# Patient Record
Sex: Female | Born: 2000 | Race: White | Hispanic: Yes | Marital: Single | Smoking: Never smoker
Health system: Southern US, Community
[De-identification: ages and names within clinical notes are randomized; demographics above are authoritative.]

## PROBLEM LIST (undated history)

## (undated) DIAGNOSIS — E063 Autoimmune thyroiditis: Secondary | ICD-10-CM

## (undated) DIAGNOSIS — R7303 Prediabetes: Secondary | ICD-10-CM

## (undated) DIAGNOSIS — E669 Obesity, unspecified: Secondary | ICD-10-CM

## (undated) DIAGNOSIS — I10 Essential (primary) hypertension: Secondary | ICD-10-CM

## (undated) DIAGNOSIS — J45909 Unspecified asthma, uncomplicated: Secondary | ICD-10-CM

## (undated) HISTORY — DX: Prediabetes: R73.03

## (undated) HISTORY — PX: NO PAST SURGERIES: SHX2092

## (undated) HISTORY — DX: Essential (primary) hypertension: I10

## (undated) HISTORY — DX: Obesity, unspecified: E66.9

## (undated) HISTORY — DX: Autoimmune thyroiditis: E06.3

---
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
## 2019-07-17 ENCOUNTER — Encounter: Payer: Self-pay | Admitting: Family Medicine

## 2019-07-17 ENCOUNTER — Ambulatory Visit (INDEPENDENT_AMBULATORY_CARE_PROVIDER_SITE_OTHER): Payer: Medicaid Other | Admitting: Family Medicine

## 2019-07-17 ENCOUNTER — Other Ambulatory Visit: Payer: Self-pay

## 2019-07-17 VITALS — BP 128/76 | HR 98 | Wt 228.6 lb

## 2019-07-17 DIAGNOSIS — R7303 Prediabetes: Secondary | ICD-10-CM

## 2019-07-17 DIAGNOSIS — O0990 Supervision of high risk pregnancy, unspecified, unspecified trimester: Secondary | ICD-10-CM | POA: Insufficient documentation

## 2019-07-17 DIAGNOSIS — Z3401 Encounter for supervision of normal first pregnancy, first trimester: Secondary | ICD-10-CM

## 2019-07-17 LAB — POCT URINE PREGNANCY: Preg Test, Ur: POSITIVE — AB

## 2019-07-17 LAB — POCT GLYCOSYLATED HEMOGLOBIN (HGB A1C): Hemoglobin A1C: 5.2 % (ref 4.0–5.6)

## 2019-07-17 MED ORDER — PRENATAL ADULT GUMMY/DHA/FA 0.4-25 MG PO CHEW: 1.0000 | CHEWABLE_TABLET | Freq: Every day | ORAL | 6 refills | Status: DC

## 2019-07-17 MED ORDER — DOXYLAMINE-PYRIDOXINE 10-10 MG PO TBEC: DELAYED_RELEASE_TABLET | ORAL | 0 refills | Status: DC

## 2019-07-17 NOTE — Progress Notes (Addendum)
   Subjective:    Jenna Benson - 18 y.o. female MRN [id number]  Date of birth: [birth year]  CC:  Jenna Benson is here to establish care.  She also has had several recent positive home pregnancy tests.  HPI:  Pregnancy Reports 3 positive home pregnancy tests, pregnancy test in our clinic is also positive Pregnancy was not planned, but patient is excited about this LMP Sept 30, although she says that her menstrual periods are irregular due to PCOS Has one female partner, he is supportive Family has been told about the pregnancy and is supportive Does report nausea and vomiting - no alleviating factors, exacerbating factors include eating, also seems to be worse in the mornings Has not been taking prenatal vitamins Continues to take her levothyroxine, has stopped metformin Does not smoke or consume alcohol   Health Maintenance:  Health Maintenance Due  Topic Date Due  . PNEUMOCOCCAL POLYSACCHARIDE VACCINE AGE 42-64 HIGH RISK  05/08/2002  . FOOT EXAM  05/08/2010  . OPHTHALMOLOGY EXAM  05/08/2010  . URINE MICROALBUMIN  05/08/2010  . INFLUENZA VACCINE  02/17/2019    -  reports that she has never smoked. She has never used smokeless tobacco. - Review of Systems: Per HPI. - Past Medical History: Patient Active Problem List   Diagnosis Date Noted  . First pregnancy, first trimester 07/17/2019  . Elevated blood pressure reading 11/09/2017  . PCOS (polycystic ovarian syndrome) 07/04/2017  . Acanthosis nigricans 05/31/2017  . Other allergic rhinitis 06/23/2015  . Hidradenitis suppurativa 11/10/2014  . Adjustment reaction 06/18/2013  . Dyspepsia 08/09/2012  . Goiter 08/09/2012  . Hypothyroidism, acquired, autoimmune   . Obesity 10/07/2011  . Prediabetes 10/07/2011   - Medications: reviewed and updated   Objective:   Physical Exam BP 128/76   Pulse 98   Wt 228 lb 9.6 oz (103.7 kg)   LMP 04/18/2019 (Exact Date)   SpO2 99%   BMI 39.32 kg/m  Gen: NAD, alert,  cooperative with exam, well-appearing, obese, pleasant CV: RRR, good S1/S2, no murmur, no edema Resp: CTABL, no wheezes, non-labored Skin: no rashes, normal turgor  Neuro: no gross deficits.  Psych: good insight, alert and oriented  FHT: Unable to find with Doppler        Assessment & Plan:   First pregnancy, first trimester LMP is considered to be accurate, gestational age is currently 12 weeks 6 days.  Since LMP's are irregular, will order dating ultrasound.  Will also perform prenatal labs today.  Counseled patient on dietary changes for pregnancy and the importance of starting a prenatal vitamin.  Sent prenatal vitamins to her pharmacy as well as a prescription for Diclegis for nausea and vomiting.  We will also obtain a TSH with free T4 today since patient has concurrent hypothyroidism.  This diagnosis means that this is a high risk pregnancy, so we will place referral for OB/GYN.  We will plan to do her initial prenatal visit if she has not seen OB/GYN by then.  Prediabetes Hemoglobin A1c obtained, was 5.2 today, which is reassuring    Maia Breslow, M.D. 07/18/2019, 12:03 PM PGY-3, [facility]

## 2019-07-17 NOTE — Patient Instructions (Signed)
It was nice meeting you today [place]!  I have sent in prenatal vitamins and medicine for your nausea.  Please take these as directed.  We are also referring you to the high risk OB clinic because of your hypothyroidism.  We will continue to take care of you until they can start your OB care.  Today we are gathering OB labs and will check a TSH as well.  We have also scheduled your dating ultrasound.  If you have any questions or concerns, please feel free to call the clinic.   Be well,  Dr. Shan Levans

## 2019-07-18 LAB — OBSTETRIC PANEL, INCLUDING HIV
Antibody Screen: NEGATIVE
Basophils Absolute: 0 10*3/uL (ref 0.0–0.2)
Basos: 0 %
EOS (ABSOLUTE): 0.2 10*3/uL (ref 0.0–0.4)
Eos: 2 %
HIV Screen 4th Generation wRfx: NONREACTIVE
Hematocrit: 40.4 % (ref 34.0–46.6)
Hemoglobin: 13.8 g/dL (ref 11.1–15.9)
Hepatitis B Surface Ag: NEGATIVE
Immature Grans (Abs): 0 10*3/uL (ref 0.0–0.1)
Immature Granulocytes: 0 %
Lymphocytes Absolute: 2.3 10*3/uL (ref 0.7–3.1)
Lymphs: 19 %
MCH: 29.4 pg (ref 26.6–33.0)
MCHC: 34.2 g/dL (ref 31.5–35.7)
MCV: 86 fL (ref 79–97)
Monocytes Absolute: 0.7 10*3/uL (ref 0.1–0.9)
Monocytes: 6 %
Neutrophils Absolute: 9.2 10*3/uL — ABNORMAL HIGH (ref 1.4–7.0)
Neutrophils: 73 %
Platelets: 582 10*3/uL — ABNORMAL HIGH (ref 150–450)
RBC: 4.69 x10E6/uL (ref 3.77–5.28)
RDW: 12.9 % (ref 11.7–15.4)
RPR Ser Ql: NONREACTIVE
Rh Factor: POSITIVE
Rubella Antibodies, IGG: 2.21 index (ref >0.99)
WBC: 12.5 10*3/uL — ABNORMAL HIGH (ref 3.4–10.8)

## 2019-07-18 LAB — TSH+FREE T4
Free T4: 1.58 ng/dL (ref 0.93–1.60)
TSH: 1.75 u[IU]/mL (ref 0.450–4.500)

## 2019-07-18 NOTE — Assessment & Plan Note (Signed)
Hemoglobin A1c obtained, was 5.2 today, which is reassuring

## 2019-07-18 NOTE — Assessment & Plan Note (Addendum)
LMP is considered to be accurate, gestational age is currently 12 weeks 6 days.  Since LMP's are irregular, will order dating ultrasound.  Will also perform prenatal labs today.  Counseled patient on dietary changes for pregnancy and the importance of starting a prenatal vitamin.  Sent prenatal vitamins to her pharmacy as well as a prescription for Diclegis for nausea and vomiting.  We will also obtain a TSH with free T4 today since patient has concurrent hypothyroidism.  This diagnosis means that this is a high risk pregnancy, so we will place referral for OB/GYN.  We will plan to do her initial prenatal visit if she has not seen OB/GYN by then.

## 2019-07-20 NOTE — L&D Delivery Note (Signed)
Delivery Note At 12:52 AM a viable female was delivered via Vaginal, Spontaneous (Presentation: LOA).  APGAR: 9,9 ; weight 3765g.   Placenta status: intact, 3v cord .  Anesthesia: Epidural Episiotomy: None Lacerations: 1st degree;Perineal;left and right Labial Suture Repair: 2.0 Est. Blood Loss (mL): 442  Mom to postpartum.  Baby to Couplet care / Skin to Skin.  Delivery: Called to room and patient was complete and pushing. Head delivered LOA. No nuchal cord present. Shoulder and body delivered in usual fashion. Infant with spontaneous cry, placed on mother's abdomen, dried and stimulated. Cord clamped x 2 after 1-minute delay, and cut by FOB under my direct supervision. Cord blood drawn. Placenta delivered spontaneously with gentle cord traction. Fundus firm with massage and Pitocin. Labia, perineum, vagina, and cervix were inspected, as noted above.    Alric Seton 03/06/2020, 3:42 AM

## 2019-07-25 ENCOUNTER — Telehealth: Payer: Self-pay | Admitting: Radiology

## 2019-07-25 NOTE — Telephone Encounter (Signed)
Left message for patient to call [facility] to schedule New OB appointment from referral

## 2019-08-01 ENCOUNTER — Other Ambulatory Visit: Payer: Self-pay

## 2019-08-01 ENCOUNTER — Other Ambulatory Visit: Payer: Self-pay | Admitting: Family Medicine

## 2019-08-01 ENCOUNTER — Ambulatory Visit (HOSPITAL_COMMUNITY)
Admission: RE | Admit: 2019-08-01 | Discharge: 2019-08-01 | Disposition: A | Discharge status: Home / Self Care | Payer: Medicaid Other | Source: Ambulatory Visit | Attending: Family Medicine | Admitting: Family Medicine

## 2019-08-01 DIAGNOSIS — Z3A08 8 weeks gestation of pregnancy: Secondary | ICD-10-CM | POA: Diagnosis not present

## 2019-08-01 DIAGNOSIS — Z3401 Encounter for supervision of normal first pregnancy, first trimester: Secondary | ICD-10-CM | POA: Insufficient documentation

## 2019-08-01 DIAGNOSIS — O26841 Uterine size-date discrepancy, first trimester: Secondary | ICD-10-CM | POA: Diagnosis not present

## 2019-08-13 ENCOUNTER — Ambulatory Visit (INDEPENDENT_AMBULATORY_CARE_PROVIDER_SITE_OTHER): Payer: Medicaid Other | Admitting: Obstetrics & Gynecology

## 2019-08-13 ENCOUNTER — Other Ambulatory Visit: Payer: Self-pay

## 2019-08-13 ENCOUNTER — Encounter: Payer: Self-pay | Admitting: Obstetrics & Gynecology

## 2019-08-13 ENCOUNTER — Other Ambulatory Visit (HOSPITAL_COMMUNITY)
Admission: RE | Admit: 2019-08-13 | Discharge: 2019-08-13 | Disposition: A | Discharge status: Home / Self Care | Payer: Medicaid Other | Source: Ambulatory Visit | Attending: Obstetrics & Gynecology | Admitting: Obstetrics & Gynecology

## 2019-08-13 VITALS — BP 132/81 | HR 82 | Wt 225.0 lb

## 2019-08-13 DIAGNOSIS — Z23 Encounter for immunization: Secondary | ICD-10-CM | POA: Diagnosis not present

## 2019-08-13 DIAGNOSIS — O99211 Obesity complicating pregnancy, first trimester: Secondary | ICD-10-CM

## 2019-08-13 DIAGNOSIS — O99210 Obesity complicating pregnancy, unspecified trimester: Secondary | ICD-10-CM

## 2019-08-13 DIAGNOSIS — Z3A09 9 weeks gestation of pregnancy: Secondary | ICD-10-CM | POA: Diagnosis not present

## 2019-08-13 DIAGNOSIS — E039 Hypothyroidism, unspecified: Secondary | ICD-10-CM | POA: Insufficient documentation

## 2019-08-13 DIAGNOSIS — Z3401 Encounter for supervision of normal first pregnancy, first trimester: Secondary | ICD-10-CM | POA: Diagnosis not present

## 2019-08-13 DIAGNOSIS — O98811 Other maternal infectious and parasitic diseases complicating pregnancy, first trimester: Secondary | ICD-10-CM

## 2019-08-13 DIAGNOSIS — A749 Chlamydial infection, unspecified: Secondary | ICD-10-CM

## 2019-08-13 DIAGNOSIS — E063 Autoimmune thyroiditis: Secondary | ICD-10-CM | POA: Diagnosis not present

## 2019-08-13 MED ORDER — BLOOD PRESSURE KIT: 1.0000 | PACK | 0 refills | Status: DC

## 2019-08-13 MED ORDER — ASPIRIN EC 81 MG PO TBEC: 81.0000 mg | DELAYED_RELEASE_TABLET | Freq: Every day | ORAL | 2 refills | Status: DC

## 2019-08-13 NOTE — Patient Instructions (Signed)
RE: MyChart  Dear Ms. Burggraf  MyChart makes it easy for you to view your health information - all in one secure location - from any computer or mobile device at any time. Use the activation code below to enroll in MyChart online at [email or web address]   Once your account is activated, you can:  Marland Kitchen View your test results. . Communicate securely with your physician's office.  . View your medical history, allergies, medications, and immunizations. . Receive care virtually through an e-Visit.   If you are over 18, you may use features of MyChart to manage the health information of your spouse, children or others you care for.  Download child and adult access forms at [email or web address].    As you activate your MyChart account and need any technical assistance, please call the MyChart technical support line at [phone]).  Be sure to also download the MyChart app for your mobile device.   Thank you for choosing [facility] for your family's health care needs!   MyChart Activation Code:  [phone] Expires: 09/27/2019  3:45 PM    [facility]  [address] [place], [place] [address]  First Trimester of Pregnancy The first trimester of pregnancy is from week 1 until the end of week 13 (months 1 through 3). A week after a sperm fertilizes an egg, the egg will implant on the wall of the uterus. This embryo will begin to develop into a baby. Genes from you and your partner will form the baby. The female genes will determine whether the baby will be a boy or a girl. At 6-8 weeks, the eyes and face will be formed, and the heartbeat can be seen on ultrasound. At the end of 12 weeks, all the baby's organs will be formed. Now that you are pregnant, you will want to do everything you can to have a healthy baby. Two of the most important things are to get good prenatal care and to follow your health care provider's instructions. Prenatal care is all the  medical care you receive before the baby's birth. This care will help prevent, find, and treat any problems during the pregnancy and childbirth. Body changes during your first trimester Your body goes through many changes during pregnancy. The changes vary from woman to woman.  You may gain or lose a couple of pounds at first.  You may feel sick to your stomach (nauseous) and you may throw up (vomit). If the vomiting is uncontrollable, call your health care provider.  You may tire easily.  You may develop headaches that can be relieved by medicines. All medicines should be approved by your health care provider.  You may urinate more often. Painful urination may mean you have a bladder infection.  You may develop heartburn as a result of your pregnancy.  You may develop constipation because certain hormones are causing the muscles that push stool through your intestines to slow down.  You may develop hemorrhoids or swollen veins (varicose veins).  Your breasts may begin to grow larger and become tender. Your nipples may stick out more, and the tissue that surrounds them (areola) may become darker.  Your gums may bleed and may be sensitive to brushing and flossing.  Dark spots or blotches (chloasma, mask of pregnancy) may develop on your face. This will likely fade after the baby is born.  Your menstrual periods will stop.  You may have a loss of appetite.  You may develop cravings for certain kinds  of food.  You may have changes in your emotions from day to day, such as being excited to be pregnant or being concerned that something may go wrong with the pregnancy and baby.  You may have more vivid and strange dreams.  You may have changes in your hair. These can include thickening of your hair, rapid growth, and changes in texture. Some women also have hair loss during or after pregnancy, or hair that feels dry or thin. Your hair will most likely return to normal after your baby is  born. What to expect at prenatal visits During a routine prenatal visit:  You will be weighed to make sure you and the baby are growing normally.  Your blood pressure will be taken.  Your abdomen will be measured to track your baby's growth.  The fetal heartbeat will be listened to between weeks 10 and 14 of your pregnancy.  Test results from any previous visits will be discussed. Your health care provider may ask you:  How you are feeling.  If you are feeling the baby move.  If you have had any abnormal symptoms, such as leaking fluid, bleeding, severe headaches, or abdominal cramping.  If you are using any tobacco products, including cigarettes, chewing tobacco, and electronic cigarettes.  If you have any questions. Other tests that may be performed during your first trimester include:  Blood tests to find your blood type and to check for the presence of any previous infections. The tests will also be used to check for low iron levels (anemia) and protein on red blood cells (Rh antibodies). Depending on your risk factors, or if you previously had diabetes during pregnancy, you may have tests to check for high blood sugar that affects pregnant women (gestational diabetes).  Urine tests to check for infections, diabetes, or protein in the urine.  An ultrasound to confirm the proper growth and development of the baby.  Fetal screens for spinal cord problems (spina bifida) and Down syndrome.  HIV (human immunodeficiency virus) testing. Routine prenatal testing includes screening for HIV, unless you choose not to have this test.  You may need other tests to make sure you and the baby are doing well. Follow these instructions at home: Medicines  Follow your health care provider's instructions regarding medicine use. Specific medicines may be either safe or unsafe to take during pregnancy.  Take a prenatal vitamin that contains at least 600 micrograms (mcg) of folic acid.  If  you develop constipation, try taking a stool softener if your health care provider approves. Eating and drinking   Eat a balanced diet that includes fresh fruits and vegetables, whole grains, good sources of protein such as meat, eggs, or tofu, and low-fat dairy. Your health care provider will help you determine the amount of weight gain that is right for you.  Avoid raw meat and uncooked cheese. These carry germs that can cause birth defects in the baby.  Eating four or five small meals rather than three large meals a day may help relieve nausea and vomiting. If you start to feel nauseous, eating a few soda crackers can be helpful. Drinking liquids between meals, instead of during meals, also seems to help ease nausea and vomiting.  Limit foods that are high in fat and processed sugars, such as fried and sweet foods.  To prevent constipation: ? Eat foods that are high in fiber, such as fresh fruits and vegetables, whole grains, and beans. ? Drink enough fluid to keep your  urine clear or pale yellow. Activity  Exercise only as directed by your health care provider. Most women can continue their usual exercise routine during pregnancy. Try to exercise for 30 minutes at least 5 days a week. Exercising will help you: ? Control your weight. ? Stay in shape. ? Be prepared for labor and delivery.  Experiencing pain or cramping in the lower abdomen or lower back is a good sign that you should stop exercising. Check with your health care provider before continuing with normal exercises.  Try to avoid standing for long periods of time. Move your legs often if you must stand in one place for a long time.  Avoid heavy lifting.  Wear low-heeled shoes and practice good posture.  You may continue to have sex unless your health care provider tells you not to. Relieving pain and discomfort  Wear a good support bra to relieve breast tenderness.  Take warm sitz baths to soothe any pain or discomfort  caused by hemorrhoids. Use hemorrhoid cream if your health care provider approves.  Rest with your legs elevated if you have leg cramps or low back pain.  If you develop varicose veins in your legs, wear support hose. Elevate your feet for 15 minutes, 3-4 times a day. Limit salt in your diet. Prenatal care  Schedule your prenatal visits by the twelfth week of pregnancy. They are usually scheduled monthly at first, then more often in the last 2 months before delivery.  Write down your questions. Take them to your prenatal visits.  Keep all your prenatal visits as told by your health care provider. This is important. Safety  Wear your seat belt at all times when driving.  Make a list of emergency phone numbers, including numbers for family, friends, the hospital, and police and fire departments. General instructions  Ask your health care provider for a referral to a local prenatal education class. Begin classes no later than the beginning of month 6 of your pregnancy.  Ask for help if you have counseling or nutritional needs during pregnancy. Your health care provider can offer advice or refer you to specialists for help with various needs.  Do not use hot tubs, steam rooms, or saunas.  Do not douche or use tampons or scented sanitary pads.  Do not cross your legs for long periods of time.  Avoid cat litter boxes and soil used by cats. These carry germs that can cause birth defects in the baby and possibly loss of the fetus by miscarriage or stillbirth.  Avoid all smoking, herbs, alcohol, and medicines not prescribed by your health care provider. Chemicals in these products affect the formation and growth of the baby.  Do not use any products that contain nicotine or tobacco, such as cigarettes and e-cigarettes. If you need help quitting, ask your health care provider. You may receive counseling support and other resources to help you quit.  Schedule a dentist appointment. At home,  brush your teeth with a soft toothbrush and be gentle when you floss. Contact a health care provider if:  You have dizziness.  You have mild pelvic cramps, pelvic pressure, or nagging pain in the abdominal area.  You have persistent nausea, vomiting, or diarrhea.  You have a bad smelling vaginal discharge.  You have pain when you urinate.  You notice increased swelling in your face, hands, legs, or ankles.  You are exposed to fifth disease or chickenpox.  You are exposed to [place] measles (rubella) and have never had  it. Get help right away if:  You have a fever.  You are leaking fluid from your vagina.  You have spotting or bleeding from your vagina.  You have severe abdominal cramping or pain.  You have rapid weight gain or loss.  You vomit blood or material that looks like coffee grounds.  You develop a severe headache.  You have shortness of breath.  You have any kind of trauma, such as from a fall or a car accident. Summary  The first trimester of pregnancy is from week 1 until the end of week 13 (months 1 through 3).  Your body goes through many changes during pregnancy. The changes vary from woman to woman.  You will have routine prenatal visits. During those visits, your health care provider will examine you, discuss any test results you may have, and talk with you about how you are feeling. This information is not intended to replace advice given to you by your health care provider. Make sure you discuss any questions you have with your health care provider. Document Revised: 06/17/2017 Document Reviewed: 06/16/2016 Elsevier Patient Education  2020 [employer].

## 2019-08-13 NOTE — Progress Notes (Signed)
History:   Jenna Benson is a 19 y.o. G1P0 at [redacted]w[redacted]d by early ultrasound not consistent with LMP being seen today for her first obstetrical visit.  Had some nausea earlier this pregnancy, since resolved.  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  Got pregnant because she missed a few birth control pills, but this is desired.  Patient reports no complaints today.     HISTORY: OB History  Gravida Para Term Preterm AB Living  1 0 0 0 0 0  SAB TAB Ectopic Multiple Live Births  0 0 0 0 0    # Outcome Date GA Lbr Len/2nd Weight Sex Delivery Anes PTL Lv  1 Current             Past Medical History:  Diagnosis Date  . Asthma   . Hypothyroidism, acquired, autoimmune   . Obesity   . Pre-diabetes   . Thyroiditis, autoimmune    History reviewed. No pertinent surgical history. Family History  Problem Relation Age of Onset  . Thyroid disease Maternal Aunt   . Diabetes Maternal Aunt        type 2  . Obesity Maternal Aunt   . Cancer Maternal Aunt        stomach  . Obesity Mother   . Hypertension Mother   . Obesity Father   . Cancer Father        prostate  . Obesity Maternal Uncle   . Obesity Maternal Uncle   . Obesity Maternal Grandmother   . Obesity Maternal Grandfather   . Hypertension Paternal Uncle   . Hypertension Paternal Uncle    Social History   Tobacco Use  . Smoking status: Never Smoker  . Smokeless tobacco: Never Used  . Tobacco comment: brother smokes outise of house  Substance Use Topics  . Alcohol use: No    Alcohol/week: 0.0 standard drinks  . Drug use: No   No Known Allergies Current Outpatient Medications on File Prior to Visit  Medication Sig Dispense Refill  . Doxylamine-Pyridoxine 10-10 MG TBEC 2 tablets orally at bedtime on day 1 and 2; if symptoms persist, take 1 tablet in morning and 2 tablets at bedtime on day 3; if symptoms persist, may increase to MAX 4 tablets per day, administered as 1 tablet in the morning, 1 tablet in mid-afternoon and 2  tablets at bedtime 60 tablet 0  . levothyroxine (SYNTHROID) 88 MCG tablet Take 1 tablet (88 mcg total) by mouth daily before breakfast. 90 tablet 1  . Prenatal MV & Min w/FA-DHA (PRENATAL ADULT GUMMY/DHA/FA) 0.4-25 MG CHEW Chew 1 tablet by mouth daily. 30 tablet 6  . albuterol (PROAIR HFA) 108 (90 Base) MCG/ACT inhaler Inhale 2 puffs into the lungs every 6 (six) hours as needed for wheezing or shortness of breath. 8.5 g 1   No current facility-administered medications on file prior to visit.    Review of Systems Pertinent items noted in HPI and remainder of comprehensive ROS otherwise negative. Physical Exam:   Vitals:   08/13/19 1507  BP: 132/81  Pulse: 82  Weight: 225 lb (102.1 kg)   Fetal Heart Rate (bpm): 156 on Bedside Ultrasound for FHR check.  CRL consistent with ~11 weeks. Patient informed that the ultrasound is considered a limited obstetric ultrasound and is not intended to be a complete ultrasound exam.  Patient also informed that the ultrasound is not being completed with the intent of assessing for fetal or placental anomalies or any pelvic abnormalities.  Explained that  the purpose of today's ultrasound is to assess for fetal heart rate.  Patient acknowledges the purpose of the exam and the limitations of the study.   General: well-developed, well-nourished female in no acute distress  Breasts:  deferred  Skin: normal coloration and turgor, no rashes  Neurologic: oriented, normal, negative, normal mood  Extremities: normal strength, tone, and muscle mass, ROM of all joints is normal  HEENT PERRLA, extraocular movement intact and sclera clear, anicteric  Mouth/Teeth mucous membranes moist, pharynx normal without lesions and dental hygiene good  Neck supple and no masses  Cardiovascular: regular rate and rhythm  Respiratory:  no respiratory distress, normal breath sounds  Abdomen: soft, non-tender; bowel sounds normal; no masses,  no organomegaly  Pelvic: deferred       Assessment:    Pregnancy: G1P0 Patient Active Problem List   Diagnosis Date Noted  . Hypothyroidism affecting pregnancy 08/13/2019  . Obesity affecting pregnancy, antepartum 08/13/2019  . Supervision of normal first teen pregnancy 07/17/2019  . PCOS (polycystic ovarian syndrome) 07/04/2017  . Acanthosis nigricans 05/31/2017  . Other allergic rhinitis 06/23/2015  . Hidradenitis suppurativa 11/10/2014  . Adjustment reaction 06/18/2013  . Dyspepsia 08/09/2012  . Goiter 08/09/2012  . Hypothyroidism, acquired, autoimmune   . Obesity 10/07/2011  . Prediabetes 10/07/2011     Plan:    1. Hypothyroidism, acquired, autoimmune Thyroid panel to be checked today. Continue Synthroid. - TSH - T4, free - T3, free  2. Obesity affecting pregnancy, antepartum Recommendations [x]  Aspirin 81 mg daily after 12 weeks [x]  Nutrition consult [x]  Weight gain 11-20 lbs for singleton pregnancy (IOM guidelines) . Higher class of obesity patients recommended to gain closer to lower limit  . Weight loss is associated with adverse outcomes [x]  Screen for DM with A1C or early 2 hr GTT [x ] Baseline and surveillance labs ordered - Hemoglobin A1c - Comprehensive metabolic panel - Protein / creatinine ratio, urine - aspirin EC 81 MG tablet; Take 1 tablet (81 mg total) by mouth daily. Take after 12 weeks for prevention of preeclampsia later in pregnancy  Dispense: 300 tablet; Refill: 2 - Referral to Nutrition and Diabetes Services  3. Supervision of normal first teen pregnancy in first trimester - Obstetric Panel, Including HIV - Culture, OB Urine - GC/Chlamydia probe amp ([place])not at [facility] - [place] MFM Fetal Nuchal Translucency; Future - Flu Vaccine QUAD 36+ mos IM (Fluarix & Fluzone Quad PF Initial labs drawn.  Flu vaccine given. Continue prenatal vitamins. Genetic Screening discussed, First trimester screen: ordered. The ultrasound will help solidify EDC given the discrepancy with LMP, formal  CRL and our [place] today.  Still considering NIPS.   Ultrasound discussed; fetal anatomic survey: to be ordered at next visit.. Problem list reviewed and updated. The nature of [place] with multiple MDs and other Advanced Practice Providers was explained to patient; also emphasized that residents, students are part of our team. Routine obstetric precautions reviewed. Return in about 4 weeks (around 09/10/2019) for Virtual OB Visit.     Verita Schneiders, MD, [place] for [employer], [place]

## 2019-08-14 ENCOUNTER — Encounter: Payer: Self-pay | Admitting: Skilled Nursing Facility1

## 2019-08-14 ENCOUNTER — Other Ambulatory Visit: Payer: Self-pay

## 2019-08-14 ENCOUNTER — Encounter: Payer: Medicaid Other | Attending: Obstetrics & Gynecology | Admitting: Skilled Nursing Facility1

## 2019-08-14 ENCOUNTER — Other Ambulatory Visit: Payer: Self-pay | Admitting: Obstetrics & Gynecology

## 2019-08-14 DIAGNOSIS — O219 Vomiting of pregnancy, unspecified: Secondary | ICD-10-CM

## 2019-08-14 DIAGNOSIS — Z3400 Encounter for supervision of normal first pregnancy, unspecified trimester: Secondary | ICD-10-CM | POA: Diagnosis not present

## 2019-08-14 DIAGNOSIS — O99210 Obesity complicating pregnancy, unspecified trimester: Secondary | ICD-10-CM | POA: Diagnosis not present

## 2019-08-14 LAB — PROTEIN / CREATININE RATIO, URINE
Creatinine, Urine: 146.7 mg/dL
Protein, Ur: 10.3 mg/dL
Protein/Creat Ratio: 70 mg/g creat (ref 0–200)

## 2019-08-14 LAB — OBSTETRIC PANEL, INCLUDING HIV
Antibody Screen: NEGATIVE
Basophils Absolute: 0.1 10*3/uL (ref 0.0–0.2)
Basos: 0 %
EOS (ABSOLUTE): 0.4 10*3/uL (ref 0.0–0.4)
Eos: 3 %
HIV Screen 4th Generation wRfx: NONREACTIVE
Hematocrit: 39.3 % (ref 34.0–46.6)
Hemoglobin: 13.6 g/dL (ref 11.1–15.9)
Hepatitis B Surface Ag: NEGATIVE
Immature Grans (Abs): 0 10*3/uL (ref 0.0–0.1)
Immature Granulocytes: 0 %
Lymphocytes Absolute: 3.3 10*3/uL — ABNORMAL HIGH (ref 0.7–3.1)
Lymphs: 23 %
MCH: 29.4 pg (ref 26.6–33.0)
MCHC: 34.6 g/dL (ref 31.5–35.7)
MCV: 85 fL (ref 79–97)
Monocytes Absolute: 1.2 10*3/uL — ABNORMAL HIGH (ref 0.1–0.9)
Monocytes: 8 %
Neutrophils Absolute: 9.8 10*3/uL — ABNORMAL HIGH (ref 1.4–7.0)
Neutrophils: 66 %
Platelets: 524 10*3/uL — ABNORMAL HIGH (ref 150–450)
RBC: 4.62 x10E6/uL (ref 3.77–5.28)
RDW: 13.3 % (ref 11.7–15.4)
RPR Ser Ql: NONREACTIVE
Rh Factor: POSITIVE
Rubella Antibodies, IGG: 1.96 index (ref >0.99)
WBC: 14.8 10*3/uL — ABNORMAL HIGH (ref 3.4–10.8)

## 2019-08-14 LAB — COMPREHENSIVE METABOLIC PANEL
ALT: 11 IU/L (ref 0–32)
AST: 15 IU/L (ref 0–40)
Albumin/Globulin Ratio: 1.4 (ref 1.2–2.2)
Albumin: 4.3 g/dL (ref 3.9–5.0)
Alkaline Phosphatase: 78 IU/L (ref 39–117)
BUN/Creatinine Ratio: 15 (ref 9–23)
BUN: 6 mg/dL (ref 6–20)
Bilirubin Total: 0.2 mg/dL (ref 0.0–1.2)
CO2: 21 mmol/L (ref 20–29)
Calcium: 10 mg/dL (ref 8.7–10.2)
Chloride: 101 mmol/L (ref 96–106)
Creatinine, Ser: 0.41 mg/dL — ABNORMAL LOW (ref 0.57–1.00)
GFR calc Af Amer: 173 mL/min/{1.73_m2} (ref >59)
GFR calc non Af Amer: 150 mL/min/{1.73_m2} (ref >59)
Globulin, Total: 3 g/dL (ref 1.5–4.5)
Glucose: 82 mg/dL (ref 65–99)
Potassium: 3.9 mmol/L (ref 3.5–5.2)
Sodium: 139 mmol/L (ref 134–144)
Total Protein: 7.3 g/dL (ref 6.0–8.5)

## 2019-08-14 LAB — T4, FREE: Free T4: 1.53 ng/dL (ref 0.93–1.60)

## 2019-08-14 LAB — HEMOGLOBIN A1C
Est. average glucose Bld gHb Est-mCnc: 108 mg/dL
Hgb A1c MFr Bld: 5.4 % (ref 4.8–5.6)

## 2019-08-14 LAB — TSH: TSH: 1.12 u[IU]/mL (ref 0.450–4.500)

## 2019-08-14 LAB — T3, FREE: T3, Free: 3.8 pg/mL (ref 2.3–5.0)

## 2019-08-14 MED ORDER — DOXYLAMINE-PYRIDOXINE 10-10 MG PO TBEC: DELAYED_RELEASE_TABLET | ORAL | 0 refills | Status: DC

## 2019-08-14 MED ORDER — PROMETHAZINE HCL 25 MG PO TABS: 25.0000 mg | ORAL_TABLET | Freq: Four times a day (QID) | ORAL | 2 refills | Status: DC | PRN

## 2019-08-14 NOTE — Progress Notes (Signed)
Patient was seen by Serena Colonel, RD today for Nutrition Counseling and reported having multiple episodes of nausea and vomiting, leading to eating one meal a day and 7 lb weight loss.  Patient did not bring this to our attention yesterday during her visit, she actually said her nausea had resolved.   Nausea/vomiting in pregnancy Ordered the following: - Doxylamine-Pyridoxine 10-10 MG TBEC; 2 tablets orally at bedtime on day 1 and 2; if symptoms persist, take 1 tablet in morning and 2 tablets at bedtime on day 3; if symptoms persist, may increase to MAX 4 tablets per day, administered as 1 tablet in the morning, 1 tablet in mid-afternoon and 2 tablets at bedtime  Dispense: 60 tablet; Refill: 0 - promethazine (PHENERGAN) 25 MG tablet; Take 1 tablet (25 mg total) by mouth every 6 (six) hours as needed for nausea or vomiting.  Dispense: 30 tablet; Refill: 2  Patient was called and informed of these prescriptions. If symptoms persist or are not controlled, will add Reglan and/or Zofran.  Continue routine prenatal care.    Jaynie Collins, MD, FACOG Obstetrician & Gynecologist, [facility] for [employer], [facility]

## 2019-08-14 NOTE — Progress Notes (Signed)
Assessment:  Primary concerns today: obesity in preganancy   Pt states she is 9 weeks and 2 days a long in her pregnancy.  Pt states she gets a lot of nausea when she eats. Pt states she has been losing weight: 2 weeks ago being 229 pounds. Pt states she has been drinking more water thinking that would help but it has not. Pt states she has stopped drinking sweet drinks. Pt states she has the nausea when she first wakes up too but mostly when she smells foods. Pt states after she eats food her stomach feels "weird inside" in a bad way and has to sit there other wise she will throw it up. Pt states she threw up this morning and has been throwing up typically 4 times a day. Pt states she was taking the nausea medicine every day but it was not helping.  Pt states she throws up every time she eats and when she stands up after eating she throws up. Pt states sometimes if she sits for about 20 minutes after eating she will not throw up. Pt states she does take a prenatal multivitamin sometimes in the morning sometimes in the evening.   Pt states she lives with her family: mom, dad, sisters; mom cooks for her most often.  Pt states she thought she could not drink hot tea because it would make the baby come out: dietitian educated pt on this topic.  Pt states she typically eats one meal a day due to the nausea stating her mom tells her that is not good. Pt states her dad made her a milk and banana smoothie but it hurt her stomach.   Dietitian gave pt some samples of supplement drinks available to test for tolerance.   MEDICATIONS: see list   DIETARY INTAKE:  24-hr recall: typically 1 meal a day B ( 10AM): 2 scrambled eggs with salt and beans with salt or half sandwich or skipped Snk ( AM):  L ( PM): skipped Snk ( PM):  D ( PM): soup: noodles and potatoes or vegetables or sandwich and salad  Snk ( PM):  Beverages: water, lemonade   Usual physical activity: walking daily but getting too tired      Nutritional Diagnosis:  [place]-3.2 Unintentional weight loss As related to emesis in preganancy.  As evidenced by loss of 8 pounds.    Intervention:  Nutrition counseling for increasing oral consumption with emesis.  Goals: Continue to not drink sugary beverages: soda, sweet tea, bottled teas Take you prenatal multi in the evening directly after you have eaten  You can drink hot tea; you just do not want to put sugar in it Do not drink anything 15 minutes before you are about to eat, nothing with your meal, and wait until 30 minutes after you eat before you drink again  Before you get out of bed in the morning sit up in bed and eat 5-6 saltines  Try alkaline water  Eat 4-5 times a day Try just plain waffles It is okay to drink carbonated drinks to relieve nausea like gingerale   Drink 2 supplement drinks a day  Stay in your room when your mom is cooking so you do not smell it; eat it room temperature so the smell is not as strong  Try peppermint oil on your upper lip Drink 1 glass of milk every night Continue to eat at least one meal a day the soup is a good option    Teaching Method  Utilized:  Ship broker Hands on  Barriers to learning/adherence to lifestyle change: none identified   Demonstrated degree of understanding via:  Teach Back   Monitoring/Evaluation:  Dietary intake, exercise, and body weight prn.

## 2019-08-15 ENCOUNTER — Encounter: Payer: Self-pay | Admitting: Obstetrics & Gynecology

## 2019-08-15 DIAGNOSIS — O98811 Other maternal infectious and parasitic diseases complicating pregnancy, first trimester: Secondary | ICD-10-CM

## 2019-08-15 DIAGNOSIS — A749 Chlamydial infection, unspecified: Secondary | ICD-10-CM | POA: Insufficient documentation

## 2019-08-15 HISTORY — DX: Chlamydial infection, unspecified: O98.811

## 2019-08-15 HISTORY — DX: Chlamydial infection, unspecified: A74.9

## 2019-08-15 LAB — GC/CHLAMYDIA PROBE AMP (~~LOC~~) NOT AT ARMC
Chlamydia: POSITIVE — AB
Comment: NEGATIVE
Comment: NORMAL
Neisseria Gonorrhea: NEGATIVE

## 2019-08-15 LAB — URINE CULTURE, OB REFLEX

## 2019-08-15 LAB — CULTURE, OB URINE

## 2019-08-15 MED ORDER — AZITHROMYCIN 500 MG PO TABS: 1000.0000 mg | ORAL_TABLET | Freq: Once | ORAL | 1 refills | Status: AC

## 2019-08-15 NOTE — Addendum Note (Signed)
Addended by: Jaynie Collins A on: 08/15/2019 03:05 PM   Modules accepted: Orders

## 2019-08-16 ENCOUNTER — Telehealth: Payer: Self-pay | Admitting: *Deleted

## 2019-08-16 NOTE — Telephone Encounter (Signed)
Called pt to inform her of results and recommendations from Dr Macon Large. Reiterated getting treated and informing partner so they can be treated as well. Will have pt come in in about 4 weeks for TOC. Pt verbalizes and understands.

## 2019-08-16 NOTE — Telephone Encounter (Signed)
-----   Message from Tereso Newcomer, MD sent at 08/15/2019  3:05 PM EST ----- Patient has chlamydia.   Testing for other STIs was negative.  She needs to let partner(s) know so the partner(s) can get testing and treatment. Patient and sex partner(s) should abstain from unprotected sexual activity for at least seven days after everyone receives appropriate treatment.  Azithromycin  was prescribed for patient.  Patient will need to return in about 4 weeks after treatment for repeat test of cure.  Please call to inform patient of results and recommendations, and advise to pick up prescription.

## 2019-08-28 ENCOUNTER — Encounter: Payer: Medicaid Other | Attending: Obstetrics & Gynecology | Admitting: Skilled Nursing Facility1

## 2019-08-28 ENCOUNTER — Other Ambulatory Visit: Payer: Self-pay

## 2019-08-28 DIAGNOSIS — O99210 Obesity complicating pregnancy, unspecified trimester: Secondary | ICD-10-CM | POA: Insufficient documentation

## 2019-08-28 NOTE — Progress Notes (Signed)
  Assessment:  Primary concerns today: obesity in preganancy   Pt arrives feeling much better and able to eat throughout the day gaining about 4-5 pounds since last visit.   Pt states she has only thrown up once and that was yesterday. Pt states the nausea is better with some days being worse. Pt states she is eating a lot now. Pt states with taking the phenergan she is able to eat a lot more and throwing up less.  MEDICATIONS: see list   DIETARY INTAKE:  24-hr recall: typically 1 meal a day B ( 9AM): eggs with potato or peantbutter sandwich with whole milk Snk ( AM): granola bar L ( PM): mashed potatoes with mac n cheese  Snk ( PM): granola bar D ( PM): soup: noodles and potatoes or vegetables or sandwich and salad  Snk ( PM): sometimes sweet bread  Beverages: water, 2 cups milk  Usual physical activity: walking daily      Intervention:  Nutrition counseling for increasing oral consumption with emesis. Handouts: [employer] in [place] and [place] (spanish for her parents) Goals: -Switch to 2% milk -have protein with your lunch and non starchy vegetables  -Add non starchy vegetables to your dinner like a side salad or adding them to your soup -Rely on fruit or vegetable as snacks -Limit to one granola bar per day -Limit sweet bread to one palm sized serving a day   Teaching Method Utilized:  Visual Auditory Hands on  Barriers to learning/adherence to lifestyle change: none identified   Demonstrated degree of understanding via:  Teach Back   Monitoring/Evaluation:  Dietary intake, exercise, and body weight prn.

## 2019-08-28 NOTE — Patient Instructions (Addendum)
-  Switch to 2% milk  -have protein with your lunch and non starchy vegetables   -Rely on fruit or vegetable as snacks  -Limit to one granola bar per day

## 2019-09-04 ENCOUNTER — Ambulatory Visit (HOSPITAL_COMMUNITY)
Admission: RE | Admit: 2019-09-04 | Discharge: 2019-09-04 | Disposition: A | Discharge status: Home / Self Care | Payer: Medicaid Other | Source: Ambulatory Visit | Attending: Obstetrics & Gynecology | Admitting: Obstetrics & Gynecology

## 2019-09-04 ENCOUNTER — Ambulatory Visit (HOSPITAL_COMMUNITY): Payer: Medicaid Other | Admitting: *Deleted

## 2019-09-04 ENCOUNTER — Other Ambulatory Visit (HOSPITAL_COMMUNITY): Payer: Self-pay | Admitting: *Deleted

## 2019-09-04 ENCOUNTER — Ambulatory Visit (HOSPITAL_COMMUNITY): Payer: Medicaid Other

## 2019-09-04 ENCOUNTER — Other Ambulatory Visit: Payer: Self-pay

## 2019-09-04 ENCOUNTER — Encounter (HOSPITAL_COMMUNITY): Payer: Self-pay

## 2019-09-04 VITALS — BP 119/68 | HR 92 | Temp 98.0°F | Wt 228.0 lb

## 2019-09-04 DIAGNOSIS — O98811 Other maternal infectious and parasitic diseases complicating pregnancy, first trimester: Secondary | ICD-10-CM | POA: Insufficient documentation

## 2019-09-04 DIAGNOSIS — O99210 Obesity complicating pregnancy, unspecified trimester: Secondary | ICD-10-CM | POA: Diagnosis not present

## 2019-09-04 DIAGNOSIS — O99211 Obesity complicating pregnancy, first trimester: Secondary | ICD-10-CM

## 2019-09-04 DIAGNOSIS — Z3682 Encounter for antenatal screening for nuchal translucency: Secondary | ICD-10-CM | POA: Insufficient documentation

## 2019-09-04 DIAGNOSIS — A749 Chlamydial infection, unspecified: Secondary | ICD-10-CM | POA: Diagnosis not present

## 2019-09-04 DIAGNOSIS — Z36 Encounter for antenatal screening for chromosomal anomalies: Secondary | ICD-10-CM

## 2019-09-04 DIAGNOSIS — Z3A13 13 weeks gestation of pregnancy: Secondary | ICD-10-CM | POA: Diagnosis not present

## 2019-09-04 DIAGNOSIS — Z3401 Encounter for supervision of normal first pregnancy, first trimester: Secondary | ICD-10-CM

## 2019-09-10 ENCOUNTER — Telehealth (INDEPENDENT_AMBULATORY_CARE_PROVIDER_SITE_OTHER): Payer: Medicaid Other | Admitting: Family Medicine

## 2019-09-10 ENCOUNTER — Encounter: Payer: Self-pay | Admitting: Family Medicine

## 2019-09-10 ENCOUNTER — Other Ambulatory Visit: Payer: Self-pay

## 2019-09-10 DIAGNOSIS — O99211 Obesity complicating pregnancy, first trimester: Secondary | ICD-10-CM

## 2019-09-10 DIAGNOSIS — O98811 Other maternal infectious and parasitic diseases complicating pregnancy, first trimester: Secondary | ICD-10-CM | POA: Diagnosis not present

## 2019-09-10 DIAGNOSIS — A749 Chlamydial infection, unspecified: Secondary | ICD-10-CM

## 2019-09-10 DIAGNOSIS — O99210 Obesity complicating pregnancy, unspecified trimester: Secondary | ICD-10-CM

## 2019-09-10 DIAGNOSIS — Z3A13 13 weeks gestation of pregnancy: Secondary | ICD-10-CM

## 2019-09-10 DIAGNOSIS — Z3402 Encounter for supervision of normal first pregnancy, second trimester: Secondary | ICD-10-CM

## 2019-09-10 NOTE — Progress Notes (Signed)
I connected with@ on 09/10/19 at  2:00 PM EST by: MyChartand verified that I am speaking with the correct person using two identifiers.  Patient is located at currently in car, parked [name]  and provider is located at [employer].     The purpose of this virtual visit is to provide medical care while limiting exposure to the novel coronavirus. I discussed the limitations, risks, security and privacy concerns of performing an evaluation and management service by MyChart and the availability of in person appointments. I also discussed with the patient that there may be a patient responsible charge related to this service. By engaging in this virtual visit, you consent to the provision of healthcare.  Additionally, you authorize for your insurance to be billed for the services provided during this visit.  The patient expressed understanding and agreed to proceed.  The following staff members participated in the virtual visit:  Scheryl Marten    PRENATAL VISIT NOTE  Subjective:  Jenna Benson is a 19 y.o. G1P0 at [redacted]w[redacted]d  for phone visit for ongoing prenatal care.  She is currently monitored for the following issues for this high-risk pregnancy and has Obesity; Prediabetes; Hypothyroidism, acquired, autoimmune; Dyspepsia; Goiter; Adjustment reaction; Other allergic rhinitis; Hidradenitis suppurativa; Acanthosis nigricans; PCOS (polycystic ovarian syndrome); Supervision of normal first teen pregnancy; Hypothyroidism affecting pregnancy; Obesity affecting pregnancy, antepartum; Nausea/vomiting in pregnancy; and Chlamydia infection affecting pregnancy in first trimester on their problem list.  Patient reports yeasterday-- reports feeling sob and weak. Reports sleeping helped.  Walking did not help. .  Contractions: Not present. Vag. Bleeding: None.   . Denies leaking of fluid.   The following portions of the patient's history were reviewed and updated as appropriate: allergies, current  medications, past family history, past medical history, past social history, past surgical history and problem list.   Objective:  There were no vitals filed for this visit. Self-Obtained  Fetal Status:           Assessment and Plan:  Pregnancy: G1P0 at [redacted]w[redacted]d 1. Chlamydia infection affecting pregnancy in first trimester Finished treatment 2 weeks ago Partner has not had treatment - no sex since she was diagnosed. Has appt with MD to have treatment/testing Reviewed the expedited partner treatment. If patient calls with partners name, DOB, allergies we can prescribe him treatment. Patient will let us know if this is needed.   2. Obesity affecting pregnancy, antepartum TWG=8 lb (3.629 kg)   3. Supervision of normal first teen pregnancy in second trimester Has anatomy US on 3/30   Preterm labor symptoms and general obstetric precautions including but not limited to vaginal bleeding, contractions, leaking of fluid and fetal movement were reviewed in detail with the patient.  Return in about 4 weeks (around 10/08/2019) for Routine prenatal care.  Future Appointments  Date Time Provider Department Center  10/08/2019  1:30 PM Federico Flake, MD [facility]  10/16/2019 12:45 PM [facility] NURSE [facility]  10/16/2019 12:45 PM [facility] [place] 2 [facility]     Time spent on virtual visit:15 minutes  Federico Flake, MD

## 2019-09-10 NOTE — Progress Notes (Signed)
Yesterday pt felt SOB and weakness, feeling better today.  Pt states she finished her treatment for chlamydia 2 weeks ago and her partner has an appointment to get checked.   I connected with  [place] S Scarantino on 09/10/19 at  2:00 PM EST by telephone and verified that I am speaking with the correct person using two identifiers.   I discussed the limitations, risks, security and privacy concerns of performing an evaluation and management service by telephone and the availability of in person appointments. I also discussed with the patient that there may be a patient responsible charge related to this service. The patient expressed understanding and agreed to proceed.  Scheryl Marten, RN 09/10/2019  1:54 PM

## 2019-09-14 LAB — MATERNIT21 PLUS CORE+SCA
Fetal Fraction: 7
Monosomy X (Turner Syndrome): NOT DETECTED
Result (T21): NEGATIVE
Trisomy 13 (Patau syndrome): NEGATIVE
Trisomy 18 (Edwards syndrome): NEGATIVE
Trisomy 21 (Down syndrome): NEGATIVE
XXX (Triple X Syndrome): NOT DETECTED
XXY (Klinefelter Syndrome): NOT DETECTED
XYY (Jacobs Syndrome): NOT DETECTED

## 2019-09-17 ENCOUNTER — Telehealth (HOSPITAL_COMMUNITY): Payer: Self-pay | Admitting: Genetic Counselor

## 2019-09-17 NOTE — Telephone Encounter (Signed)
I called Ms. Jenna Benson to discuss her negative noninvasive prenatal screening (NIPS)/cell free DNA (cfDNA) testing result. Specifically, Ms. Jenna Benson had MaterniT21 testing through [facility]. These negative results demonstrated an expected representation of chromosome 21, 18, 13, and sex chromosome material, greatly reducing the likelihood of trisomies 34, 100, or 44 and sex chromosome aneuploidies for the pregnancy. Ms. Jenna Benson requested that the fetal sex not be disclosed at this time. She would like to receive an envelope containing the expected fetal sex at her next ultrasound on 3/30.  NIPS analyzes placental (fetal) DNA in maternal circulation. NIPS is considered to be highly specific and sensitive, but is not considered to be a diagnostic test. We reviewed that this testing identifies 91-99% of pregnancies with trisomies 36, 61, and 45, as well as sex chromosome abnormalities, but does not test for all genetic conditions. Diagnostic testing via amniocentesis is available any time after 16 weeks' gestation should she be interested in confirming this result. She confirmed that she had no questions about these results at this time.  Gershon Crane, MS, [facility] Genetic Counselor

## 2019-09-18 ENCOUNTER — Inpatient Hospital Stay (HOSPITAL_COMMUNITY)
Admission: AD | Admit: 2019-09-18 | Discharge: 2019-09-18 | Disposition: A | Discharge status: Home / Self Care | Payer: Medicaid Other | Attending: Obstetrics and Gynecology | Admitting: Obstetrics and Gynecology

## 2019-09-18 ENCOUNTER — Encounter (HOSPITAL_COMMUNITY): Payer: Self-pay | Admitting: Emergency Medicine

## 2019-09-18 ENCOUNTER — Other Ambulatory Visit: Payer: Self-pay

## 2019-09-18 DIAGNOSIS — O209 Hemorrhage in early pregnancy, unspecified: Secondary | ICD-10-CM | POA: Diagnosis not present

## 2019-09-18 DIAGNOSIS — E063 Autoimmune thyroiditis: Secondary | ICD-10-CM | POA: Diagnosis not present

## 2019-09-18 DIAGNOSIS — Z3A15 15 weeks gestation of pregnancy: Secondary | ICD-10-CM

## 2019-09-18 DIAGNOSIS — B9689 Other specified bacterial agents as the cause of diseases classified elsewhere: Secondary | ICD-10-CM | POA: Diagnosis not present

## 2019-09-18 DIAGNOSIS — N760 Acute vaginitis: Secondary | ICD-10-CM | POA: Diagnosis not present

## 2019-09-18 DIAGNOSIS — O98812 Other maternal infectious and parasitic diseases complicating pregnancy, second trimester: Secondary | ICD-10-CM | POA: Diagnosis not present

## 2019-09-18 DIAGNOSIS — O23592 Infection of other part of genital tract in pregnancy, second trimester: Secondary | ICD-10-CM | POA: Diagnosis not present

## 2019-09-18 DIAGNOSIS — Z7982 Long term (current) use of aspirin: Secondary | ICD-10-CM | POA: Diagnosis not present

## 2019-09-18 DIAGNOSIS — O99282 Endocrine, nutritional and metabolic diseases complicating pregnancy, second trimester: Secondary | ICD-10-CM | POA: Diagnosis not present

## 2019-09-18 LAB — URINALYSIS, ROUTINE W REFLEX MICROSCOPIC
Bilirubin Urine: NEGATIVE
Glucose, UA: NEGATIVE mg/dL
Hgb urine dipstick: NEGATIVE
Ketones, ur: NEGATIVE mg/dL
Leukocytes,Ua: NEGATIVE
Nitrite: NEGATIVE
Protein, ur: NEGATIVE mg/dL
Specific Gravity, Urine: 1.02 (ref 1.005–1.030)
pH: 7 (ref 5.0–8.0)

## 2019-09-18 LAB — WET PREP, GENITAL
Sperm: NONE SEEN
Trich, Wet Prep: NONE SEEN
Yeast Wet Prep HPF POC: NONE SEEN

## 2019-09-18 MED ORDER — METRONIDAZOLE 0.75 % VA GEL: 1.0000 | Freq: Two times a day (BID) | VAGINAL | 0 refills | Status: DC

## 2019-09-18 MED ORDER — PROMETHAZINE HCL 25 MG PO TABS: 25.0000 mg | ORAL_TABLET | Freq: Four times a day (QID) | ORAL | 1 refills | Status: DC | PRN

## 2019-09-18 NOTE — MAU Provider Note (Signed)
Chief Complaint: Vaginal Bleeding   First Provider Initiated Contact with Patient 09/18/19 0256     SUBJECTIVE HPI: Jenna Benson is a 19 y.o. G1P0 at 25w0dwho presents to Maternity Admissions reporting vaginal bleeding. Reports she had an episode of vomiting around 1 am this morning. Immediately afterwards she noticed some spotting on toilet paper & lower abdominal cramping. Since then her symptoms have resolved without intervention. She was treated for chlamydia in January. States she is unsure if her partner was treated but states she hasn't had intercourse since she was treated. Denies dysuria, vaginal discharge, diarrhea, constipation, or continued n/v. Goes to [facility] for prenatal care.    Past Medical History:  Diagnosis Date  . Asthma   . Hypothyroidism, acquired, autoimmune   . Obesity   . Pre-diabetes   . Thyroiditis, autoimmune    OB History  Gravida Para Term Preterm AB Living  1            SAB TAB Ectopic Multiple Live Births               # Outcome Date GA Lbr Len/2nd Weight Sex Delivery Anes PTL Lv  1 Current            Past Surgical History:  Procedure Laterality Date  . NO PAST SURGERIES     Social History   Socioeconomic History  . Marital status: Single    Spouse name: Not on file  . Number of children: Not on file  . Years of education: Not on file  . Highest education level: Not on file  Occupational History  . Not on file  Tobacco Use  . Smoking status: Never Smoker  . Smokeless tobacco: Never Used  . Tobacco comment: brother smokes outise of house  Substance and Sexual Activity  . Alcohol use: No    Alcohol/week: 0.0 standard drinks  . Drug use: No  . Sexual activity: Yes    Birth control/protection: Pill  Other Topics Concern  . Not on file  Social History Narrative    Lives with Mom, Dad, 2 sisters.    Social Determinants of Health   Financial Resource Strain:   . Difficulty of Paying Living Expenses: Not on file  Food  Insecurity:   . Worried About RCharity fundraiserin the Last Year: Not on file  . Ran Out of Food in the Last Year: Not on file  Transportation Needs:   . Lack of Transportation (Medical): Not on file  . Lack of Transportation (Non-Medical): Not on file  Physical Activity:   . Days of Exercise per Week: Not on file  . Minutes of Exercise per Session: Not on file  Stress:   . Feeling of Stress : Not on file  Social Connections:   . Frequency of Communication with Friends and Family: Not on file  . Frequency of Social Gatherings with Friends and Family: Not on file  . Attends Religious Services: Not on file  . Active Member of Clubs or Organizations: Not on file  . Attends CArchivistMeetings: Not on file  . Marital Status: Not on file  Intimate Partner Violence:   . Fear of Current or Ex-Partner: Not on file  . Emotionally Abused: Not on file  . Physically Abused: Not on file  . Sexually Abused: Not on file   Family History  Problem Relation Age of Onset  . Thyroid disease Maternal Aunt   . Diabetes Maternal Aunt  type 2  . Obesity Maternal Aunt   . Cancer Maternal Aunt        stomach  . Obesity Mother   . Hypertension Mother   . Obesity Father   . Cancer Father        prostate  . Obesity Maternal Uncle   . Obesity Maternal Uncle   . Obesity Maternal Grandmother   . Obesity Maternal Grandfather   . Hypertension Paternal Uncle   . Hypertension Paternal Uncle    No current facility-administered medications on file prior to encounter.   Current Outpatient Medications on File Prior to Encounter  Medication Sig Dispense Refill  . aspirin EC 81 MG tablet Take 1 tablet (81 mg total) by mouth daily. Take after 12 weeks for prevention of preeclampsia later in pregnancy 300 tablet 2  . Blood Pressure KIT 1 Device by Does not apply route once a week. To be monitored weekly from home 1 kit 0  . Doxylamine-Pyridoxine 10-10 MG TBEC 2 tablets orally at bedtime on  day 1 and 2; if symptoms persist, take 1 tablet in morning and 2 tablets at bedtime on day 3; if symptoms persist, may increase to MAX 4 tablets per day, administered as 1 tablet in the morning, 1 tablet in mid-afternoon and 2 tablets at bedtime 60 tablet 0  . levothyroxine (SYNTHROID) 88 MCG tablet Take 1 tablet (88 mcg total) by mouth daily before breakfast. 90 tablet 1  . Prenatal MV & Min w/FA-DHA (PRENATAL ADULT GUMMY/DHA/FA) 0.4-25 MG CHEW Chew 1 tablet by mouth daily. 30 tablet 6  . promethazine (PHENERGAN) 25 MG tablet Take 1 tablet (25 mg total) by mouth every 6 (six) hours as needed for nausea or vomiting. 30 tablet 2  . albuterol (PROAIR HFA) 108 (90 Base) MCG/ACT inhaler Inhale 2 puffs into the lungs every 6 (six) hours as needed for wheezing or shortness of breath. 8.5 g 1   No Known Allergies  I have reviewed patient's Past Medical Hx, Surgical Hx, Family Hx, Social Hx, medications and allergies.   Review of Systems  Constitutional: Negative.   Gastrointestinal: Positive for abdominal pain (none currently) and vomiting.  Genitourinary: Positive for vaginal bleeding (none currently).    OBJECTIVE Patient Vitals for the past 24 hrs:  BP Temp Temp src Pulse Resp SpO2 Height Weight  09/18/19 0221 133/64 - - 89 16 99 % - -  09/18/19 0208 131/68 98.4 F (36.9 C) - 86 17 - - 104.3 kg  09/18/19 0143 - - - - - - '5\' 3"'  (1.6 m) 110 kg  09/18/19 0133 134/77 98.2 F (36.8 C) Oral 89 18 100 % - -   Constitutional: Well-developed, well-nourished female in no acute distress.  Cardiovascular: normal rate & rhythm, no murmur Respiratory: normal rate and effort. Lung sounds clear throughout GI: Abd soft, non-tender, Pos BS x 4. No guarding or rebound tenderness MS: Extremities nontender, no edema, normal ROM Neurologic: Alert and oriented x 4.  GU: Cervix closed/thick. Small amount of foul smelling thin discharge. No blood.   LAB RESULTS Results for orders placed or performed during the  hospital encounter of 09/18/19 (from the past 24 hour(s))  Urinalysis, Routine w reflex microscopic     Status: Abnormal   Collection Time: 09/18/19  3:15 AM  Result Value Ref Range   Color, Urine YELLOW YELLOW   APPearance HAZY (A) CLEAR   Specific Gravity, Urine 1.020 1.005 - 1.030   pH 7.0 5.0 - 8.0   Glucose,  UA NEGATIVE NEGATIVE mg/dL   Hgb urine dipstick NEGATIVE NEGATIVE   Bilirubin Urine NEGATIVE NEGATIVE   Ketones, ur NEGATIVE NEGATIVE mg/dL   Protein, ur NEGATIVE NEGATIVE mg/dL   Nitrite NEGATIVE NEGATIVE   Leukocytes,Ua NEGATIVE NEGATIVE  Wet prep, genital     Status: Abnormal   Collection Time: 09/18/19  3:15 AM   Specimen: Urine, Clean Catch  Result Value Ref Range   Yeast Wet Prep HPF POC NONE SEEN NONE SEEN   Trich, Wet Prep NONE SEEN NONE SEEN   Clue Cells Wet Prep HPF POC PRESENT (A) NONE SEEN   WBC, Wet Prep HPF POC FEW (A) NONE SEEN   Sperm NONE SEEN     IMAGING No results found.  MAU COURSE Orders Placed This Encounter  Procedures  . Wet prep, genital  . Urinalysis, Routine w reflex microscopic  . Discharge patient   Meds ordered this encounter  Medications  . metroNIDAZOLE (METROGEL VAGINAL) 0.75 % vaginal gel    Sig: Place 1 Applicatorful vaginally 2 (two) times daily.    Dispense:  70 g    Refill:  0    Order Specific Question:   Supervising Provider    Answer:   Arlina Robes L [[id number]]    MDM FHT present via doppler RH positive Pt symptoms resolved prior to arrival  Cervix closed & no blood on exam  Wet prep + clue cells. Pt reports foul smelling discharge. Will treat for BV with metrogel.   ASSESSMENT 1. Bacterial vaginosis   2. [redacted] weeks gestation of pregnancy     PLAN Discharge home in stable condition. Discussed reasons to return to MAU Rx metrogel GC/CT pending   Allergies as of 09/18/2019   No Known Allergies     Medication List    TAKE these medications   albuterol 108 (90 Base) MCG/ACT inhaler Commonly known as:  ProAir HFA Inhale 2 puffs into the lungs every 6 (six) hours as needed for wheezing or shortness of breath.   aspirin EC 81 MG tablet Take 1 tablet (81 mg total) by mouth daily. Take after 12 weeks for prevention of preeclampsia later in pregnancy   Blood Pressure Kit 1 Device by Does not apply route once a week. To be monitored weekly from home   Doxylamine-Pyridoxine 10-10 MG Tbec 2 tablets orally at bedtime on day 1 and 2; if symptoms persist, take 1 tablet in morning and 2 tablets at bedtime on day 3; if symptoms persist, may increase to MAX 4 tablets per day, administered as 1 tablet in the morning, 1 tablet in mid-afternoon and 2 tablets at bedtime   levothyroxine 88 MCG tablet Commonly known as: SYNTHROID Take 1 tablet (88 mcg total) by mouth daily before breakfast.   metroNIDAZOLE 0.75 % vaginal gel Commonly known as: METROGEL VAGINAL Place 1 Applicatorful vaginally 2 (two) times daily.   Prenatal Adult Gummy/DHA/FA 0.4-25 MG Chew Chew 1 tablet by mouth daily.   promethazine 25 MG tablet Commonly known as: PHENERGAN Take 1 tablet (25 mg total) by mouth every 6 (six) hours as needed for nausea or vomiting.        Jorje Guild, NP 09/18/2019  3:48 AM

## 2019-09-18 NOTE — MAU Note (Signed)
Patient started vomiting today around 0100 and then had some bleeding and abdominal cramping.  Denies complications w/ the pregnancy or bleeding up to this point.  No other discharge.

## 2019-09-18 NOTE — ED Provider Notes (Signed)
Patient is a G1P0 approx. [redacted] weeks pregnant that comes in with vaginal bleeding and vomiting.  Symptoms started today.  Mild associated cramping.  VSS.    Case discussed with CNM at MAU.  Patient transferred to MAU for further care.   Roxy Horseman, PA-C 09/18/19 0200    Palumbo, April, MD 09/18/19 [phone]

## 2019-09-18 NOTE — ED Triage Notes (Addendum)
Patient reports vaginal bleeding and low abdominal cramping this evening , patient stated G1P0 - 3 months pregnant with prenatal visit. Ree Shay PA notified for initial screening .

## 2019-09-18 NOTE — Discharge Instructions (Signed)
Bacterial Vaginosis  Bacterial vaginosis is a vaginal infection that occurs when the normal balance of bacteria in the vagina is disrupted. It results from an overgrowth of certain bacteria. This is the most common vaginal infection among women ages 15-44. Because bacterial vaginosis increases your risk for STIs (sexually transmitted infections), getting treated can help reduce your risk for chlamydia, gonorrhea, herpes, and HIV (human immunodeficiency virus). Treatment is also important for preventing complications in pregnant women, because this condition can cause an early (premature) delivery. What are the causes? This condition is caused by an increase in harmful bacteria that are normally present in small amounts in the vagina. However, the reason that the condition develops is not fully understood. What increases the risk? The following factors may make you more likely to develop this condition:  Having a new sexual partner or multiple sexual partners.  Having unprotected sex.  Douching.  Having an intrauterine device (IUD).  Smoking.  Drug and alcohol abuse.  Taking certain antibiotic medicines.  Being pregnant. You cannot get bacterial vaginosis from toilet seats, bedding, swimming pools, or contact with objects around you. What are the signs or symptoms? Symptoms of this condition include:  Grey or white vaginal discharge. The discharge can also be watery or foamy.  A fish-like odor with discharge, especially after sexual intercourse or during menstruation.  Itching in and around the vagina.  Burning or pain with urination. Some women with bacterial vaginosis have no signs or symptoms. How is this diagnosed? This condition is diagnosed based on:  Your medical history.  A physical exam of the vagina.  Testing a sample of vaginal fluid under a microscope to look for a large amount of bad bacteria or abnormal cells. Your health care provider may use a cotton swab or  a small wooden spatula to collect the sample. How is this treated? This condition is treated with antibiotics. These may be given as a pill, a vaginal cream, or a medicine that is put into the vagina (suppository). If the condition comes back after treatment, a second round of antibiotics may be needed. Follow these instructions at home: Medicines  Take over-the-counter and prescription medicines only as told by your health care provider.  Take or use your antibiotic as told by your health care provider. Do not stop taking or using the antibiotic even if you start to feel better. General instructions  If you have a female sexual partner, tell her that you have a vaginal infection. She should see her health care provider and be treated if she has symptoms. If you have a female sexual partner, he does not need treatment.  During treatment: ? Avoid sexual activity until you finish treatment. ? Do not douche. ? Avoid alcohol as directed by your health care provider. ? Avoid breastfeeding as directed by your health care provider.  Drink enough water and fluids to keep your urine clear or pale yellow.  Keep the area around your vagina and rectum clean. ? Wash the area daily with warm water. ? Wipe yourself from front to back after using the toilet.  Keep all follow-up visits as told by your health care provider. This is important. How is this prevented?  Do not douche.  Wash the outside of your vagina with warm water only.  Use protection when having sex. This includes latex condoms and dental dams.  Limit how many sexual partners you have. To help prevent bacterial vaginosis, it is best to have sex with just one partner (  monogamous).  Make sure you and your sexual partner are tested for STIs.  Wear cotton or cotton-lined underwear.  Avoid wearing tight pants and pantyhose, especially during summer.  Limit the amount of alcohol that you drink.  Do not use any products that contain  nicotine or tobacco, such as cigarettes and e-cigarettes. If you need help quitting, ask your health care provider.  Do not use illegal drugs. Where to find more information  Centers for Disease Control and Prevention: www.cdc.gov/std  American Sexual Health Association (ASHA): www.ashastd.org  U.[place]. Department of Health and Human Services, Office on Women's Health: www.womenshealth.gov/ or https://www.womenshealth.gov/a-z-topics/bacterial-vaginosis Contact a health care provider if:  Your symptoms do not improve, even after treatment.  You have more discharge or pain when urinating.  You have a fever.  You have pain in your abdomen.  You have pain during sex.  You have vaginal bleeding between periods. Summary  Bacterial vaginosis is a vaginal infection that occurs when the normal balance of bacteria in the vagina is disrupted.  Because bacterial vaginosis increases your risk for STIs (sexually transmitted infections), getting treated can help reduce your risk for chlamydia, gonorrhea, herpes, and HIV (human immunodeficiency virus). Treatment is also important for preventing complications in pregnant women, because the condition can cause an early (premature) delivery.  This condition is treated with antibiotic medicines. These may be given as a pill, a vaginal cream, or a medicine that is put into the vagina (suppository). This information is not intended to replace advice given to you by your health care provider. Make sure you discuss any questions you have with your health care provider. Document Revised: 06/17/2017 Document Reviewed: 03/20/2016 Elsevier Patient Education  2020 Elsevier Inc.  

## 2019-09-19 LAB — GC/CHLAMYDIA PROBE AMP (~~LOC~~) NOT AT ARMC
Chlamydia: NEGATIVE
Comment: NEGATIVE
Comment: NORMAL
Neisseria Gonorrhea: NEGATIVE

## 2019-10-08 ENCOUNTER — Telehealth (INDEPENDENT_AMBULATORY_CARE_PROVIDER_SITE_OTHER): Payer: Medicaid Other | Admitting: Family Medicine

## 2019-10-08 VITALS — BP 149/80

## 2019-10-08 DIAGNOSIS — O99210 Obesity complicating pregnancy, unspecified trimester: Secondary | ICD-10-CM

## 2019-10-08 DIAGNOSIS — O98811 Other maternal infectious and parasitic diseases complicating pregnancy, first trimester: Secondary | ICD-10-CM

## 2019-10-08 DIAGNOSIS — I10 Essential (primary) hypertension: Secondary | ICD-10-CM | POA: Insufficient documentation

## 2019-10-08 DIAGNOSIS — O169 Unspecified maternal hypertension, unspecified trimester: Secondary | ICD-10-CM

## 2019-10-08 DIAGNOSIS — R7303 Prediabetes: Secondary | ICD-10-CM

## 2019-10-08 DIAGNOSIS — E039 Hypothyroidism, unspecified: Secondary | ICD-10-CM

## 2019-10-08 DIAGNOSIS — A749 Chlamydial infection, unspecified: Secondary | ICD-10-CM

## 2019-10-08 DIAGNOSIS — O99282 Endocrine, nutritional and metabolic diseases complicating pregnancy, second trimester: Secondary | ICD-10-CM

## 2019-10-08 DIAGNOSIS — Z3402 Encounter for supervision of normal first pregnancy, second trimester: Secondary | ICD-10-CM

## 2019-10-08 MED ORDER — AZITHROMYCIN 500 MG PO TABS: 1000.0000 mg | ORAL_TABLET | Freq: Once | ORAL | 0 refills | Status: AC

## 2019-10-08 NOTE — Progress Notes (Signed)
I connected with@ on 10/08/19 at  1:30 PM EDT by: Mychart and verified that I am speaking with the correct person using two identifiers.  Patient is located at home and provider is located at [facility].     The purpose of this virtual visit is to provide medical care while limiting exposure to the novel coronavirus. I discussed the limitations, risks, security and privacy concerns of performing an evaluation and management service by Mychart and the availability of in person appointments. I also discussed with the patient that there may be a patient responsible charge related to this service. By engaging in this virtual visit, you consent to the provision of healthcare.  Additionally, you authorize for your insurance to be billed for the services provided during this visit.  The patient expressed understanding and agreed to proceed.  The following staff members participated in the virtual visit:  Darrold Junker- CMA student, Matthew Saras CMA    PRENATAL VISIT NOTE  Subjective:  Jenna Benson is a 19 y.o. G1P0 at [redacted]w[redacted]d  for phone visit for ongoing prenatal care.  She is currently monitored for the following issues for this high-risk pregnancy and has Obesity; Prediabetes; Hypothyroidism, acquired, autoimmune; Dyspepsia; Goiter; Adjustment reaction; Other allergic rhinitis; Hidradenitis suppurativa; Acanthosis nigricans; PCOS (polycystic ovarian syndrome); Supervision of normal first teen pregnancy; Hypothyroidism affecting pregnancy; Obesity affecting pregnancy, antepartum; Nausea/vomiting in pregnancy; Chlamydia infection affecting pregnancy in first trimester; and Elevated blood pressure affecting pregnancy, antepartum on their problem list.  Patient reports no complaints.   . Vag. Bleeding: None.   . Denies leaking of fluid.   The following portions of the patient's history were reviewed and updated as appropriate: allergies, current medications, past family history, past medical history, past  social history, past surgical history and problem list.   Objective:   Vitals:   10/08/19 1334 10/08/19 1340 10/08/19 1358  BP: (!) 184/84 (!) 173/84 (!) 149/80   Self-Obtained  Fetal Status:           Assessment and Plan:  Pregnancy: G1P0 at [redacted]w[redacted]d 1. Chlamydia infection affecting pregnancy in first trimester Treated and unsure if partner treated but not sex since treatment Given rx for expedited partner treatment. Attached info to AVS for patient about EPT  2. Obesity affecting pregnancy, antepartum TWG=8 lb (3.629 kg)  - Babyscripts Schedule Optimization  3. Hypothyroidism affecting pregnancy in second trimester Lab Results  Component Value Date   TSH 1.120 08/13/2019    4. Supervision of normal first teen pregnancy in second trimester Up to date Has anatomy scan scheduled Having occasional cramps, encouraged hydration. Discussed broad and round ligament pain.  5. Prediabetes Will have standard 2ht GTT at 28wk.   6. Elevated BP Continued > 140 SBP Asked patient to log 3 BP this week  Reviewed sx and patient has none. This maybe cHTN since < 20 wks.    Preterm labor symptoms and general obstetric precautions including but not limited to vaginal bleeding, contractions, leaking of fluid and fetal movement were reviewed in detail with the patient.  Return in about 4 weeks (around 11/05/2019) for Routine prenatal care, in person.  Future Appointments  Date Time Provider Department Center  10/16/2019 12:45 PM [facility] NURSE [facility]  10/16/2019 12:45 PM [facility] [place] 2 [facility]  11/05/2019  1:15 PM Anyanwu, Jethro Bastos, MD [facility]     Time spent on virtual visit: 15 minutes  Federico Flake, MD

## 2019-10-08 NOTE — Patient Instructions (Signed)
Expedited Partner Therapy:  °Information Sheet for Patients and Partners  °            ° °You have been offered expedited partner therapy (EPT). This information sheet contains important information and warnings you need to be aware of, so please read it carefully.  ° °Expedited Partner Therapy (EPT) is the clinical practice of treating the sexual partners of persons who receive chlamydia, gonorrhea, or trichomoniasis diagnoses by providing medications or prescriptions to the patient. Patients then provide partners with these therapies without the health-care provider having examined the partner. In other words, EPT is a convenient, fast and private way for patients to help their sexual partners get treated.  ° °Chlamydia and gonorrhea are bacterial infections you get from having sex with a person who is already infected. Trichomoniasis (or “trich”) is a very common sexually transmitted infection (STI) that is caused by infection with a protozoan parasite called Trichomonas vaginalis.  Many people with these infections don’t know it because they feel fine, but without treatment these infections can cause serious health problems, such as pelvic inflammatory disease, ectopic pregnancy, infertility and increased risk of HIV.  ° °It is important to get treated as soon as possible to protect your health, to avoid spreading these infections to others, and to prevent yourself from becoming re-infected. The good news is these infections can be easily cured with proper antibiotic medicine. The best way to take care of your self is to see a doctor or go to your local health department. If you are not able to see a doctor or other medical provider, you should take EPT.  ° ° °Recommended Medication: °EPT for Chlamydia:  Azithromycin (Zithromax) 1 gram orally in a single dose °EPT for Gonorrhea:  Cefixime (Suprax) 400 milligrams orally in a single dose PLUS azithromycin (Zithromax) 1 gram orally in a single dose °EPT for  Trichomoniasis:  Metronidazole (Flagyl) 2 grams orally in a single dose ° ° °These medicines are very safe. However, you should not take them if you have ever had an allergic reaction (like a rash) to any of these medicines: azithromycin (Zithromax), erythromycin, clarithromycin (Biaxin), metronidazole (Flagyl), tinidazole (Tindimax). If you are uncertain about whether you have an allergy, call your medical provider or pharmacist before taking this medicine. If you have a serious, long-term illness like kidney, liver or heart disease, colitis or stomach problems, or you are currently taking other prescription medication, talk to your provider before taking this medication.  ° °Women: If you have lower belly pain, pain during sex, vomiting, or a fever, do not take this medicine. Instead, you should see a medical provider to be certain you do not have pelvic inflammatory disease (PID). PID can be serious and lead to infertility, pregnancy problems or chronic pelvic pain.  ° °Pregnant Women: It is very important for you to see a doctor to get pregnancy services and pre-natal care. These antibiotics for EPT are safe for pregnant women, but you still need to see a medical provider as soon as possible. It is also important to note that Doxycycline is an alternative therapy for chlamydia, but it should not be taken by someone who is pregnant.  ° °Men: If you have pain or swelling in the testicles or a fever, do not take this medicine and see a medical provider.    ° °Men who have sex with men (MSM): MSM in [place] continue to experience high rates of syphilis and HIV. Many MSM with gonorrhea or   chlamydia could also have syphilis and/or HIV and not know it. If you are a man who has sex with other men, it is very important that you see a medical provider and are tested for HIV and syphilis. EPT is not recommended for gonorrhea for MSM.  Recommended treatment for gonorrhea for MSM is Rocephin (shot) AND azithromycin  due to decreased cure rate.  Please see your medical provider if this is the case.   ° °Along with this information sheet is a prescription for the medicine. If you receive a prescription it will be in your name and will indicate your date of birth, or it will be in the name of “Expedited Partner Therapy”.   In either case, you can have the prescription filled at a pharmacy. You will be responsible for the cost of the medicine, unless you have prescription drug coverage. In that case, you could provide your name so the pharmacy could bill your health plan.  ° °Take the medication as directed. Some people will have a mild, upset stomach, which does not last long. AVOID alcohol 24 hours after taking metronidazole (Flagyl) to reduce the possibility of a disulfiram-like reaction (severe vomiting and abdominal pain).  After taking the medicine, do not have sex for 7 days. Do not share this medicine or give it to anyone else. It is important to tell everyone you have had sex with in the last 60 days that they need to go and get tested for sexually transmitted infections.  ° °Ways to prevent these and other sexually transmitted infections (STIs):  ° °• Abstain from sex. This is the only sure way to avoid getting an STI.  °• Use barrier methods, such as condoms, consistently and correctly.  °• Limit the number of sexual partners.  °• Have regular physical exams, including testing for STIs.  ° °For more information about EPT or other issues pertaining to an STI, please contact your medical provider or the [place] [facility] at [phone] or [email or web address]   ° °

## 2019-10-16 ENCOUNTER — Encounter (HOSPITAL_COMMUNITY): Payer: Self-pay

## 2019-10-16 ENCOUNTER — Other Ambulatory Visit: Payer: Self-pay

## 2019-10-16 ENCOUNTER — Other Ambulatory Visit (HOSPITAL_COMMUNITY): Payer: Self-pay | Admitting: *Deleted

## 2019-10-16 ENCOUNTER — Ambulatory Visit (HOSPITAL_COMMUNITY): Payer: Medicaid Other | Admitting: *Deleted

## 2019-10-16 ENCOUNTER — Ambulatory Visit (HOSPITAL_COMMUNITY)
Admission: RE | Admit: 2019-10-16 | Discharge: 2019-10-16 | Disposition: A | Discharge status: Home / Self Care | Payer: Medicaid Other | Source: Ambulatory Visit | Attending: Obstetrics and Gynecology | Admitting: Obstetrics and Gynecology

## 2019-10-16 ENCOUNTER — Other Ambulatory Visit (HOSPITAL_COMMUNITY): Payer: Self-pay | Admitting: Maternal & Fetal Medicine

## 2019-10-16 DIAGNOSIS — Z363 Encounter for antenatal screening for malformations: Secondary | ICD-10-CM | POA: Diagnosis not present

## 2019-10-16 DIAGNOSIS — Z3A19 19 weeks gestation of pregnancy: Secondary | ICD-10-CM | POA: Diagnosis not present

## 2019-10-16 DIAGNOSIS — A749 Chlamydial infection, unspecified: Secondary | ICD-10-CM

## 2019-10-16 DIAGNOSIS — O99212 Obesity complicating pregnancy, second trimester: Secondary | ICD-10-CM | POA: Diagnosis not present

## 2019-10-16 DIAGNOSIS — O169 Unspecified maternal hypertension, unspecified trimester: Secondary | ICD-10-CM

## 2019-10-16 DIAGNOSIS — O99210 Obesity complicating pregnancy, unspecified trimester: Secondary | ICD-10-CM

## 2019-10-16 DIAGNOSIS — O99282 Endocrine, nutritional and metabolic diseases complicating pregnancy, second trimester: Secondary | ICD-10-CM

## 2019-10-16 DIAGNOSIS — Z362 Encounter for other antenatal screening follow-up: Secondary | ICD-10-CM

## 2019-10-16 DIAGNOSIS — O98811 Other maternal infectious and parasitic diseases complicating pregnancy, first trimester: Secondary | ICD-10-CM | POA: Diagnosis not present

## 2019-10-16 DIAGNOSIS — E039 Hypothyroidism, unspecified: Secondary | ICD-10-CM | POA: Diagnosis not present

## 2019-11-05 ENCOUNTER — Ambulatory Visit (INDEPENDENT_AMBULATORY_CARE_PROVIDER_SITE_OTHER): Payer: Medicaid Other | Admitting: Obstetrics & Gynecology

## 2019-11-05 ENCOUNTER — Other Ambulatory Visit: Payer: Self-pay

## 2019-11-05 VITALS — BP 130/81 | HR 96 | Wt 244.0 lb

## 2019-11-05 DIAGNOSIS — E039 Hypothyroidism, unspecified: Secondary | ICD-10-CM

## 2019-11-05 DIAGNOSIS — O99210 Obesity complicating pregnancy, unspecified trimester: Secondary | ICD-10-CM | POA: Diagnosis not present

## 2019-11-05 DIAGNOSIS — O0992 Supervision of high risk pregnancy, unspecified, second trimester: Secondary | ICD-10-CM

## 2019-11-05 DIAGNOSIS — O99282 Endocrine, nutritional and metabolic diseases complicating pregnancy, second trimester: Secondary | ICD-10-CM

## 2019-11-05 DIAGNOSIS — O99212 Obesity complicating pregnancy, second trimester: Secondary | ICD-10-CM

## 2019-11-05 DIAGNOSIS — Z3A21 21 weeks gestation of pregnancy: Secondary | ICD-10-CM

## 2019-11-05 NOTE — Patient Instructions (Signed)
Return to office for any scheduled appointments. Call the office or go to the MAU at [facility] if:  You begin to have strong, frequent contractions  Your water breaks.  Sometimes it is a big gush of fluid, sometimes it is just a trickle that keeps getting your panties wet or running down your legs  You have vaginal bleeding.  It is normal to have a small amount of spotting if your cervix was checked.   You do not feel your baby moving like normal.  If you do not, get something to eat and drink and lay down and focus on feeling your baby move.   If your baby is still not moving like normal, you should call the office or go to MAU.  Any other obstetric concerns.   

## 2019-11-05 NOTE — Progress Notes (Signed)
   PRENATAL VISIT NOTE  Subjective:  Jenna Benson is a 19 y.o. G1P0 at [redacted]w[redacted]d being seen today for ongoing prenatal care.  She is currently monitored for the following issues for this high-risk pregnancy and has Obesity; Prediabetes; Hypothyroidism, acquired, autoimmune; Dyspepsia; Goiter; Adjustment reaction; Other allergic rhinitis; Hidradenitis suppurativa; Acanthosis nigricans; PCOS (polycystic ovarian syndrome); Supervision of high-risk pregnancy; Hypothyroidism affecting pregnancy; Maternal morbid obesity, antepartum (HCC); Nausea/vomiting in pregnancy; Chlamydia infection affecting pregnancy in first trimester; and Elevated blood pressure affecting pregnancy, antepartum on their problem list.  Patient reports no complaints.  Contractions: Not present.  .  Movement: Present. Denies leaking of fluid.   The following portions of the patient's history were reviewed and updated as appropriate: allergies, current medications, past family history, past medical history, past social history, past surgical history and problem list.   Objective:   Vitals:   11/05/19 1312  BP: 130/81  Pulse: 96  Weight: 244 lb (110.7 kg)    Fetal Status: Fetal Heart Rate (bpm): 142    Movement: Present     General:  Alert, oriented and cooperative. Patient is in no acute distress.  Skin: Skin is warm and dry. No rash noted.   Cardiovascular: Normal heart rate noted  Respiratory: Normal respiratory effort, no problems with respiration noted  Abdomen: Soft, gravid, appropriate for gestational age.  Pain/Pressure: Absent     Pelvic: Cervical exam deferred        Extremities: Normal range of motion.  Edema: Trace  Mental Status: Normal mood and affect. Normal behavior. Normal judgment and thought content.   Assessment and Plan:  Pregnancy: G1P0 at [redacted]w[redacted]d 1. Maternal morbid obesity, antepartum (HCC) TWG 24 lb, above the recommended 11-20 lbs.  This is concerning, talked about associated morbidity with  increased weight gain.   Offered follow up with Nutrition, she agreed. Referral placed.  Will check CMET and HgbA1C today.  - Referral to [facility] - Comprehensive metabolic panel - Hemoglobin A1c  2. Hypothyroidism affecting pregnancy in second trimester Was euthyroid in 07/2019, on Synthroid 88 mcg daily. Will recheck labs today.  - TSH - T3, free - T4, free  3. Supervision of high risk pregnancy in second trimester Follow up anatomy scan next week, will follow up results and manage accordingly.  Low risk NIPS, AFP screen today.  - AFP, Serum, Open Spina Bifida Had some elevated home BP on home cuff, normal here today. Could be due to cuff size/habitus discrepancy.  Advised to take BP on forearm, will see if this makes a difference.   Preterm labor symptoms and general obstetric precautions including but not limited to vaginal bleeding, contractions, leaking of fluid and fetal movement were reviewed in detail with the patient. Please refer to After Visit Summary for other counseling recommendations.   Return in about 3 weeks (around 11/26/2019) for OFFICE OB Visit.  Future Appointments  Date Time Provider Department Center  11/13/2019  3:15 PM [facility] NURSE [facility]  11/13/2019  3:15 PM [facility] [place] 4 [facility]    Jaynie Collins, MD

## 2019-11-06 LAB — T3, FREE: T3, Free: 3 pg/mL (ref 2.3–5.0)

## 2019-11-06 LAB — COMPREHENSIVE METABOLIC PANEL WITH GFR
ALT: 8 IU/L (ref 0–32)
AST: 11 IU/L (ref 0–40)
Albumin/Globulin Ratio: 1.4 (ref 1.2–2.2)
Albumin: 3.7 g/dL — ABNORMAL LOW (ref 3.9–5.0)
Alkaline Phosphatase: 79 IU/L (ref 39–117)
BUN/Creatinine Ratio: 16 (ref 9–23)
BUN: 7 mg/dL (ref 6–20)
Bilirubin Total: <0.2 mg/dL (ref 0.0–1.2)
CO2: 20 mmol/L (ref 20–29)
Calcium: 9.5 mg/dL (ref 8.7–10.2)
Chloride: 104 mmol/L (ref 96–106)
Creatinine, Ser: 0.45 mg/dL — ABNORMAL LOW (ref 0.57–1.00)
GFR calc Af Amer: 168 mL/min/1.73
GFR calc non Af Amer: 146 mL/min/1.73
Globulin, Total: 2.6 g/dL (ref 1.5–4.5)
Glucose: 105 mg/dL — ABNORMAL HIGH (ref 65–99)
Potassium: 3.9 mmol/L (ref 3.5–5.2)
Sodium: 140 mmol/L (ref 134–144)
Total Protein: 6.3 g/dL (ref 6.0–8.5)

## 2019-11-06 LAB — AFP, SERUM, OPEN SPINA BIFIDA
AFP MoM: 0.98
AFP Value: 54.8 ng/mL
Gest. Age on Collection Date: 21.9 weeks
Maternal Age At EDD: 19.8 yr
OSBR Risk 1 IN: 10000
Test Results:: NEGATIVE
Weight: 244 [lb_av]

## 2019-11-06 LAB — T4, FREE: Free T4: 1.06 ng/dL (ref 0.93–1.60)

## 2019-11-06 LAB — TSH: TSH: 2.52 u[IU]/mL (ref 0.450–4.500)

## 2019-11-06 LAB — HEMOGLOBIN A1C
Est. average glucose Bld gHb Est-mCnc: 100 mg/dL
Hgb A1c MFr Bld: 5.1 % (ref 4.8–5.6)

## 2019-11-13 ENCOUNTER — Ambulatory Visit (HOSPITAL_COMMUNITY): Payer: Medicaid Other | Admitting: *Deleted

## 2019-11-13 ENCOUNTER — Encounter (HOSPITAL_COMMUNITY): Payer: Self-pay

## 2019-11-13 ENCOUNTER — Other Ambulatory Visit: Payer: Self-pay

## 2019-11-13 ENCOUNTER — Ambulatory Visit (HOSPITAL_COMMUNITY)
Admission: RE | Admit: 2019-11-13 | Discharge: 2019-11-13 | Disposition: A | Discharge status: Home / Self Care | Payer: Medicaid Other | Source: Ambulatory Visit | Attending: Obstetrics and Gynecology | Admitting: Obstetrics and Gynecology

## 2019-11-13 DIAGNOSIS — O169 Unspecified maternal hypertension, unspecified trimester: Secondary | ICD-10-CM | POA: Insufficient documentation

## 2019-11-13 DIAGNOSIS — O98811 Other maternal infectious and parasitic diseases complicating pregnancy, first trimester: Secondary | ICD-10-CM | POA: Insufficient documentation

## 2019-11-13 DIAGNOSIS — O99210 Obesity complicating pregnancy, unspecified trimester: Secondary | ICD-10-CM | POA: Diagnosis not present

## 2019-11-13 DIAGNOSIS — O99212 Obesity complicating pregnancy, second trimester: Secondary | ICD-10-CM

## 2019-11-13 DIAGNOSIS — Z3A23 23 weeks gestation of pregnancy: Secondary | ICD-10-CM | POA: Diagnosis not present

## 2019-11-13 DIAGNOSIS — E039 Hypothyroidism, unspecified: Secondary | ICD-10-CM

## 2019-11-13 DIAGNOSIS — Z362 Encounter for other antenatal screening follow-up: Secondary | ICD-10-CM | POA: Diagnosis not present

## 2019-11-13 DIAGNOSIS — O99282 Endocrine, nutritional and metabolic diseases complicating pregnancy, second trimester: Secondary | ICD-10-CM | POA: Diagnosis not present

## 2019-11-13 DIAGNOSIS — A749 Chlamydial infection, unspecified: Secondary | ICD-10-CM | POA: Diagnosis not present

## 2019-11-14 ENCOUNTER — Other Ambulatory Visit: Payer: Self-pay

## 2019-11-14 ENCOUNTER — Encounter: Payer: Medicaid Other | Attending: Obstetrics & Gynecology | Admitting: Registered"

## 2019-11-14 ENCOUNTER — Other Ambulatory Visit (HOSPITAL_COMMUNITY): Payer: Self-pay | Admitting: *Deleted

## 2019-11-14 DIAGNOSIS — O99282 Endocrine, nutritional and metabolic diseases complicating pregnancy, second trimester: Secondary | ICD-10-CM

## 2019-11-14 DIAGNOSIS — O99210 Obesity complicating pregnancy, unspecified trimester: Secondary | ICD-10-CM | POA: Insufficient documentation

## 2019-11-14 DIAGNOSIS — O99810 Abnormal glucose complicating pregnancy: Secondary | ICD-10-CM

## 2019-11-14 DIAGNOSIS — E039 Hypothyroidism, unspecified: Secondary | ICD-10-CM

## 2019-11-15 ENCOUNTER — Encounter: Payer: Self-pay | Admitting: Registered"

## 2019-11-15 DIAGNOSIS — O99810 Abnormal glucose complicating pregnancy: Secondary | ICD-10-CM | POA: Insufficient documentation

## 2019-11-15 NOTE — Progress Notes (Signed)
Patient was seen on 11/14/19 for Gestational Diabetes self-management class at [facility]. The following learning objectives were met by the patient during this course:   States the definition of Gestational Diabetes  States why dietary management is important in controlling blood glucose  Describes the effects each nutrient has on blood glucose levels  Demonstrates ability to create a balanced meal plan  Demonstrates carbohydrate counting   States when to check blood glucose levels  Demonstrates proper blood glucose monitoring techniques  States the effect of stress and exercise on blood glucose levels  States the importance of limiting caffeine and abstaining from alcohol and smoking  Blood glucose monitor given: [employer] #[id number] Exp: 12/18/20 Blood Glucose: 108 mg/dL  Patient instructed to monitor glucose levels: FBS: 60 - <95; 1 hour: <140; 2 hour: <120  Patient received handouts:  Nutrition Diabetes and Pregnancy, including carb counting list  Patient will be seen for follow-up as needed.

## 2019-11-24 ENCOUNTER — Other Ambulatory Visit: Payer: Self-pay | Admitting: Pediatrics

## 2019-11-24 DIAGNOSIS — E034 Atrophy of thyroid (acquired): Secondary | ICD-10-CM

## 2019-11-26 ENCOUNTER — Other Ambulatory Visit: Payer: Self-pay

## 2019-11-26 ENCOUNTER — Ambulatory Visit (INDEPENDENT_AMBULATORY_CARE_PROVIDER_SITE_OTHER): Payer: Medicaid Other | Admitting: Obstetrics & Gynecology

## 2019-11-26 VITALS — BP 121/78 | HR 92 | Wt 248.0 lb

## 2019-11-26 DIAGNOSIS — O0992 Supervision of high risk pregnancy, unspecified, second trimester: Secondary | ICD-10-CM

## 2019-11-26 DIAGNOSIS — O99210 Obesity complicating pregnancy, unspecified trimester: Secondary | ICD-10-CM

## 2019-11-26 DIAGNOSIS — O162 Unspecified maternal hypertension, second trimester: Secondary | ICD-10-CM | POA: Diagnosis not present

## 2019-11-26 LAB — CBC
Hematocrit: 38.2 % (ref 34.0–46.6)
Hemoglobin: 12.7 g/dL (ref 11.1–15.9)
MCH: 28.7 pg (ref 26.6–33.0)
MCHC: 33.2 g/dL (ref 31.5–35.7)
MCV: 86 fL (ref 79–97)
Platelets: 515 10*3/uL — ABNORMAL HIGH (ref 150–450)
RBC: 4.43 x10E6/uL (ref 3.77–5.28)
RDW: 12.9 % (ref 11.7–15.4)
WBC: 15.4 10*3/uL — ABNORMAL HIGH (ref 3.4–10.8)

## 2019-11-26 NOTE — Progress Notes (Signed)
   PRENATAL VISIT NOTE  Subjective:  Jenna Benson is a 19 y.o. G1P0 at 75w6dbeing seen today for ongoing prenatal care.  She is currently monitored for the following issues for this high-risk pregnancy and has Obesity; Prediabetes; Hypothyroidism, acquired, autoimmune; Dyspepsia; Goiter; Adjustment reaction; Other allergic rhinitis; Hidradenitis suppurativa; Acanthosis nigricans; PCOS (polycystic ovarian syndrome); Supervision of high-risk pregnancy; Hypothyroidism affecting pregnancy; M[place] morbid obesity, [place]partum ([place]; Nausea/vomiting in pregnancy; Chlamydia infection affecting pregnancy in first trimester; Elevated blood pressure affecting pregnancy, antepartum; and Abnormal glucose tolerance test (GTT) during pregnancy, antepartum on their problem list.  Patient reports no complaints. Patient denies any headaches, visual symptoms, RUQ/epigastric pain or other concerning symptoms.  Contractions: Not present.  .  Movement: Present. Denies leaking of fluid.   The following portions of the patient's history were reviewed and updated as appropriate: allergies, current medications, past family history, past medical history, past social history, past surgical history and problem list.   Objective:   Vitals:   11/26/19 1354  BP: (!) 148/89  Pulse: 97  Weight: 248 lb (112.5 kg)    Fetal Status: Fetal Heart Rate (bpm): 141   Movement: Present     General:  Alert, oriented and cooperative. Patient is in no acute distress.  Skin: Skin is warm and dry. No rash noted.   Cardiovascular: Normal heart rate noted  Respiratory: Normal respiratory effort, no problems with respiration noted  Abdomen: Soft, gravid, appropriate for gestational age.  Pain/Pressure: Absent     Pelvic: Cervical exam deferred        Extremities: Normal range of motion.  Edema: Trace  Mental Status: Normal mood and affect. Normal behavior. Normal judgment and thought content.   Assessment and Plan:    Pregnancy: G1P0 at 247w6d. Elevated blood pressure affecting pregnancy in second trimester, antepartum Had some elevated readings around 17 weeks.  Preeclampsia precautions advised.  - CBC - Comprehensive metabolic panel - Protein / creatinine ratio, urine  2. Maternal morbid obesity, antepartum (HCC) TWG 28 lbs. Already met with Nutritionist.  Cautioned about gaining too much weight. Exercise recommended.  3. Supervision of high risk pregnancy in second trimester Preterm labor symptoms and general obstetric precautions including but not limited to vaginal bleeding, contractions, leaking of fluid and fetal movement were reviewed in detail with the patient. Please refer to After Visit Summary for other counseling recommendations.   Return in about 3 weeks (around 12/17/2019) for 3rd trimester labs, TDap, OFFICE OB Visit.  Future Appointments  Date Time Provider [place]  2:[facility]  2:45 PM [facility] [place]5 [facility]  UgVerita SchneidersMD

## 2019-11-26 NOTE — Patient Instructions (Addendum)
Return to office for any scheduled appointments. Call the office or go to the [facility] at [facility] [place] if:  You begin to have strong, frequent contractions  Your water breaks.  Sometimes it is a big gush of fluid, sometimes it is just a trickle that keeps getting your panties wet or running down your legs  You have vaginal bleeding.  It is normal to have a small amount of spotting if your cervix was checked.   You do not feel your baby moving like normal.  If you do not, get something to eat and drink and lay down and focus on feeling your baby move.   If your baby is still not moving like normal, you should call the office or go to MAU.  Any other obstetric concerns.   Hypertension During Pregnancy High blood pressure (hypertension) is when the force of blood pumping through the arteries is too strong. Arteries are blood vessels that carry blood from the heart throughout the body. Hypertension during pregnancy can be mild or severe. Severe hypertension during pregnancy (preeclampsia) is a medical emergency that requires prompt evaluation and treatment. Different types of hypertension can happen during pregnancy. These include:  Chronic hypertension. This happens when you had high blood pressure before you became pregnant, and it continues during the pregnancy. Hypertension that develops before you are [redacted] weeks pregnant and continues during the pregnancy is also called chronic hypertension. If you have chronic hypertension, it will not go away after you have your baby. You will need follow-up visits with your health care provider after you have your baby. Your doctor may want you to keep taking medicine for your blood pressure.  Gestational hypertension. This is hypertension that develops after the 20th week of pregnancy. Gestational hypertension usually goes away after you have your baby, but your health care provider will need to monitor your blood pressure to make sure  that it is getting better.  Preeclampsia. This is severe hypertension during pregnancy. This can cause serious complications for you and your baby and can also cause complications for you after the delivery of your baby.  Postpartum preeclampsia. You may develop severe hypertension after giving birth. This usually occurs within 48 hours after childbirth but may occur up to 6 weeks after giving birth. This is rare. How does this affect me? Women who have hypertension during pregnancy have a greater chance of developing hypertension later in life or during future pregnancies. In some cases, hypertension during pregnancy can cause serious complications, such as:  Stroke.  Heart attack.  Injury to other organs, such as kidneys, lungs, or liver.  Preeclampsia.  Convulsions or seizures.  Placental abruption. How does this affect my baby? Hypertension during pregnancy can affect your baby. Your baby may:  Be born early (prematurely).  Not weigh as much as he or she should at birth (low birth weight).  Not tolerate labor well, leading to an unplanned cesarean delivery. What are the risks? There are certain factors that make it more likely for you to develop hypertension during pregnancy. These include:  Having hypertension during a previous pregnancy.  Being overweight.  Being age 35 or older.  Being pregnant for the first time.  Being pregnant with more than one baby.  Becoming pregnant using fertilization methods, such as IVF (in vitro fertilization).  Having other medical problems, such as diabetes, kidney disease, or lupus.  Having a family history of hypertension. What can I do to lower my risk?   The exact cause of hypertension during pregnancy is not known. You may be able to lower your risk by:  Maintaining a healthy weight.  Eating a healthy and balanced diet.  Following your health care provider's instructions about treating any long-term conditions that you had  before becoming pregnant. It is very important to keep all of your prenatal care appointments. Your health care provider will check your blood pressure and make sure that your pregnancy is progressing as expected. If a problem is found, early treatment can prevent complications. How is this treated? Treatment for hypertension during pregnancy varies depending on the type of hypertension you have and how serious it is.  If you were taking medicine for high blood pressure before you became pregnant, talk with your health care provider. You may need to change medicine during pregnancy because some medicines, like ACE inhibitors, may not be considered safe for your baby.  If you have gestational hypertension, your health care provider may order medicine to treat this during pregnancy.  If you are at risk for preeclampsia, your health care provider may recommend that you take a low-dose aspirin during your pregnancy.  If you have severe hypertension, you may need to be hospitalized so you and your baby can be monitored closely. You may also need to be given medicine to lower your blood pressure. This medicine may be given by mouth or through an IV.  In some cases, if your condition gets worse, you may need to deliver your baby early. Follow these instructions at home: Eating and drinking   Drink enough fluid to keep your urine pale yellow.  Avoid caffeine. Lifestyle  Do not use any products that contain nicotine or tobacco, such as cigarettes, e-cigarettes, and chewing tobacco. If you need help quitting, ask your health care provider.  Do not use alcohol or drugs.  Avoid stress as much as possible.  Rest and get plenty of sleep.  Regular exercise can help to reduce your blood pressure. Ask your health care provider what kinds of exercise are best for you. General instructions  Take over-the-counter and prescription medicines only as told by your health care provider.  Keep all prenatal  and follow-up visits as told by your health care provider. This is important. Contact a health care provider if:  You have symptoms that your health care provider told you may require more treatment or monitoring, such as: ? Headaches. ? Nausea or vomiting. ? Abdominal pain. ? Dizziness. ? Light-headedness. Get help right away if:  You have: ? Severe abdominal pain that does not get better with treatment. ? A severe headache that does not get better. ? Vomiting that does not get better. ? Sudden, rapid weight gain. ? Sudden swelling in your hands, ankles, or face. ? Vaginal bleeding. ? Blood in your urine. ? Blurred or double vision. ? Shortness of breath or chest pain. ? Weakness on one side of your body. ? Difficulty speaking.  Your baby is not moving as much as usual. Summary  High blood pressure (hypertension) is when the force of blood pumping through the arteries is too strong.  Hypertension during pregnancy can cause problems for you and your baby.  Treatment for hypertension during pregnancy varies depending on the type of hypertension you have and how serious it is.  Keep all prenatal and follow-up visits as told by your health care provider. This is important. This information is not intended to replace advice given to you by your health care provider.   Make sure you discuss any questions you have with your health care provider. Document Revised: 10/26/2018 Document Reviewed: 08/01/2018 Elsevier Patient Education  2020 [employer]    TDaP Vaccine Pregnancy Get the Whooping Cough Vaccine While You Are Pregnant (CDC)  It is important for women to get the whooping cough vaccine in the third trimester of each pregnancy. Vaccines are the best way to prevent this disease. There are 2 different whooping cough vaccines. Both vaccines combine protection against whooping cough, tetanus and diphtheria, but they are for different age groups: Tdap: for everyone 11 years or  older, including pregnant women  DTaP: for children 2 months through 71 years of age  You need the whooping cough vaccine during each of your pregnancies The recommended time to get the shot is during your 27th through 36th week of pregnancy, preferably during the earlier part of this time period. The Centers for Disease Control and Prevention (CDC) recommends that pregnant women receive the whooping cough vaccine for adolescents and adults (called Tdap vaccine) during the third trimester of each pregnancy. The recommended time to get the shot is during your 27th through 36th week of pregnancy, preferably during the earlier part of this time period. This replaces the original recommendation that pregnant women get the vaccine only if they had not previously received it. The [employer]nec[employer] and the [employer] support this recommendation.  You should get the whooping cough vaccine while pregnant to pass protection to your baby frame support disabled and/or not supported in this browser  Learn why Vernona Rieger decided to get the whooping cough vaccine in her 3rd trimester of pregnancy and how her baby girl was born with some protection against the disease. Also available on YouTube. After receiving the whooping cough vaccine, your body will create protective antibodies (proteins produced by the body to fight off diseases) and pass some of them to your baby before birth. These antibodies provide your baby some short-term protection against whooping cough in early life. These antibodies can also protect your baby from some of the more serious complications that come along with whooping cough. Your protective antibodies are at their highest about 2 weeks after getting the vaccine, but it takes time to pass them to your baby. So the preferred time to get the whooping cough vaccine is early in your third trimester. The amount of whooping cough antibodies in  your body decreases over time. That is why CDC recommends you get a whooping cough vaccine during each pregnancy. Doing so allows each of your babies to get the greatest number of protective antibodies from you. This means each of your babies will get the best protection possible against this disease.  Getting the whooping cough vaccine while pregnant is better than getting the vaccine after you give birth Whooping cough vaccination during pregnancy is ideal so your baby will have short-term protection as soon as he is born. This early protection is important because your baby will not start getting his whooping cough vaccines until he is 2 months old. These first few months of life are when your baby is at greatest risk for catching whooping cough. This is also when he's at greatest risk for having severe, potentially life-threating complications from the infection. To avoid that gap in protection, it is best to get a whooping cough vaccine during pregnancy. You will then pass protection to your baby before he is born. To continue protecting your baby, he should get whooping  cough vaccines starting at 2 months old. You may never have gotten the Tdap vaccine before and did not get it during this pregnancy. If so, you should make sure to get the vaccine immediately after you give birth, before leaving the hospital or birthing center. It will take about 2 weeks before your body develops protection (antibodies) in response to the vaccine. Once you have protection from the vaccine, you are less likely to give whooping cough to your newborn while caring for him. But remember, your baby will still be at risk for catching whooping cough from others. A recent study looked to see how effective Tdap was at preventing whooping cough in babies whose mothers got the vaccine while pregnant or in the hospital after giving birth. The study found that getting Tdap between 27 through 36 weeks of pregnancy is 85% more effective  at preventing whooping cough in babies younger than 2 months old. Blood tests cannot tell if you need a whooping cough vaccine There are no blood tests that can tell you if you have enough antibodies in your body to protect yourself or your baby against whooping cough. Even if you have been sick with whooping cough in the past or previously received the vaccine, you still should get the vaccine during each pregnancy. Breastfeeding may pass some protective antibodies onto your baby By breastfeeding, you may pass some antibodies you have made in response to the vaccine to your baby. When you get a whooping cough vaccine during your pregnancy, you will have antibodies in your breast milk that you can share with your baby as soon as your milk comes in. However, your baby will not get protective antibodies immediately if you wait to get the whooping cough vaccine until after delivering your baby. This is because it takes about 2 weeks for your body to create antibodies. Learn more about the health benefits of breastfeeding.

## 2019-11-27 LAB — COMPREHENSIVE METABOLIC PANEL
ALT: 6 IU/L (ref 0–32)
AST: 11 IU/L (ref 0–40)
Albumin/Globulin Ratio: 1.3 (ref 1.2–2.2)
Albumin: 3.9 g/dL (ref 3.9–5.0)
Alkaline Phosphatase: 89 IU/L (ref 39–117)
BUN/Creatinine Ratio: 12 (ref 9–23)
BUN: 5 mg/dL — ABNORMAL LOW (ref 6–20)
Bilirubin Total: <0.2 mg/dL (ref 0.0–1.2)
CO2: 20 mmol/L (ref 20–29)
Calcium: 9.4 mg/dL (ref 8.7–10.2)
Chloride: 102 mmol/L (ref 96–106)
Creatinine, Ser: 0.42 mg/dL — ABNORMAL LOW (ref 0.57–1.00)
GFR calc Af Amer: 172 mL/min/{1.73_m2} (ref >59)
GFR calc non Af Amer: 149 mL/min/{1.73_m2} (ref >59)
Globulin, Total: 3.1 g/dL (ref 1.5–4.5)
Glucose: 78 mg/dL (ref 65–99)
Potassium: 4.1 mmol/L (ref 3.5–5.2)
Sodium: 135 mmol/L (ref 134–144)
Total Protein: 7 g/dL (ref 6.0–8.5)

## 2019-11-27 LAB — PROTEIN / CREATININE RATIO, URINE
Creatinine, Urine: 79.9 mg/dL
Protein, Ur: 9.2 mg/dL
Protein/Creat Ratio: 115 mg/g creat (ref 0–200)

## 2019-12-11 ENCOUNTER — Ambulatory Visit: Payer: Medicaid Other | Admitting: *Deleted

## 2019-12-11 ENCOUNTER — Other Ambulatory Visit: Payer: Self-pay

## 2019-12-11 ENCOUNTER — Other Ambulatory Visit: Payer: Self-pay | Admitting: *Deleted

## 2019-12-11 ENCOUNTER — Encounter: Payer: Self-pay | Admitting: *Deleted

## 2019-12-11 ENCOUNTER — Ambulatory Visit (HOSPITAL_COMMUNITY): Payer: Medicaid Other | Attending: Obstetrics and Gynecology

## 2019-12-11 DIAGNOSIS — O99282 Endocrine, nutritional and metabolic diseases complicating pregnancy, second trimester: Secondary | ICD-10-CM | POA: Diagnosis not present

## 2019-12-11 DIAGNOSIS — O98811 Other maternal infectious and parasitic diseases complicating pregnancy, first trimester: Secondary | ICD-10-CM | POA: Diagnosis not present

## 2019-12-11 DIAGNOSIS — O99280 Endocrine, nutritional and metabolic diseases complicating pregnancy, unspecified trimester: Secondary | ICD-10-CM

## 2019-12-11 DIAGNOSIS — Z3A27 27 weeks gestation of pregnancy: Secondary | ICD-10-CM | POA: Diagnosis not present

## 2019-12-11 DIAGNOSIS — O169 Unspecified maternal hypertension, unspecified trimester: Secondary | ICD-10-CM | POA: Insufficient documentation

## 2019-12-11 DIAGNOSIS — O99210 Obesity complicating pregnancy, unspecified trimester: Secondary | ICD-10-CM | POA: Insufficient documentation

## 2019-12-11 DIAGNOSIS — E039 Hypothyroidism, unspecified: Secondary | ICD-10-CM | POA: Diagnosis not present

## 2019-12-11 DIAGNOSIS — Z362 Encounter for other antenatal screening follow-up: Secondary | ICD-10-CM | POA: Diagnosis not present

## 2019-12-11 DIAGNOSIS — A749 Chlamydial infection, unspecified: Secondary | ICD-10-CM

## 2019-12-11 DIAGNOSIS — Z3689 Encounter for other specified antenatal screening: Secondary | ICD-10-CM

## 2019-12-18 ENCOUNTER — Other Ambulatory Visit: Payer: Self-pay

## 2019-12-18 ENCOUNTER — Ambulatory Visit (INDEPENDENT_AMBULATORY_CARE_PROVIDER_SITE_OTHER): Payer: Medicaid Other | Admitting: Family Medicine

## 2019-12-18 VITALS — BP 120/78 | HR 92 | Wt 255.0 lb

## 2019-12-18 DIAGNOSIS — Z23 Encounter for immunization: Secondary | ICD-10-CM | POA: Diagnosis not present

## 2019-12-18 DIAGNOSIS — O99283 Endocrine, nutritional and metabolic diseases complicating pregnancy, third trimester: Secondary | ICD-10-CM | POA: Diagnosis not present

## 2019-12-18 DIAGNOSIS — E039 Hypothyroidism, unspecified: Secondary | ICD-10-CM

## 2019-12-18 DIAGNOSIS — O99213 Obesity complicating pregnancy, third trimester: Secondary | ICD-10-CM

## 2019-12-18 DIAGNOSIS — Z3A28 28 weeks gestation of pregnancy: Secondary | ICD-10-CM

## 2019-12-18 DIAGNOSIS — O0992 Supervision of high risk pregnancy, unspecified, second trimester: Secondary | ICD-10-CM | POA: Diagnosis not present

## 2019-12-18 DIAGNOSIS — O99210 Obesity complicating pregnancy, unspecified trimester: Secondary | ICD-10-CM

## 2019-12-18 DIAGNOSIS — E282 Polycystic ovarian syndrome: Secondary | ICD-10-CM

## 2019-12-18 DIAGNOSIS — O0993 Supervision of high risk pregnancy, unspecified, third trimester: Secondary | ICD-10-CM

## 2019-12-18 DIAGNOSIS — E6609 Other obesity due to excess calories: Secondary | ICD-10-CM

## 2019-12-18 LAB — CBC
Hematocrit: 36.2 % (ref 34.0–46.6)
Hemoglobin: 11.9 g/dL (ref 11.1–15.9)
MCH: 28.5 pg (ref 26.6–33.0)
MCHC: 32.9 g/dL (ref 31.5–35.7)
MCV: 87 fL (ref 79–97)
Platelets: 501 10*3/uL — ABNORMAL HIGH (ref 150–450)
RBC: 4.18 x10E6/uL (ref 3.77–5.28)
RDW: 13.1 % (ref 11.7–15.4)
WBC: 16.9 10*3/uL — ABNORMAL HIGH (ref 3.4–10.8)

## 2019-12-18 MED ORDER — ASPIRIN EC 81 MG PO TBEC: 81.0000 mg | DELAYED_RELEASE_TABLET | Freq: Every day | ORAL | 2 refills | Status: DC

## 2019-12-18 NOTE — Progress Notes (Signed)
tdap  today Refill on Aspirin

## 2019-12-18 NOTE — Progress Notes (Signed)
   PRENATAL VISIT NOTE  Subjective:  Jenna Benson is a 19 y.o. G1P0 at [redacted]w[redacted]d being seen today for ongoing prenatal care.  She is currently monitored for the following issues for this high-risk pregnancy and has Obesity; Prediabetes; Hypothyroidism, acquired, autoimmune; Dyspepsia; Goiter; Adjustment reaction; Other allergic rhinitis; Hidradenitis suppurativa; Acanthosis nigricans; PCOS (polycystic ovarian syndrome); Supervision of high-risk pregnancy; Hypothyroidism affecting pregnancy; Maternal morbid obesity, antepartum (HCC); Nausea/vomiting in pregnancy; Chlamydia infection affecting pregnancy in first trimester; Elevated blood pressure affecting pregnancy, antepartum; and Abnormal glucose tolerance test (GTT) during pregnancy, antepartum on their problem list.  Patient reports no complaints.  Contractions: Not present.  .  Movement: Present. Denies leaking of fluid.   The following portions of the patient's history were reviewed and updated as appropriate: allergies, current medications, past family history, past medical history, past social history, past surgical history and problem list.   Objective:   Vitals:   12/18/19 0815  BP: 120/78  Pulse: 92  Weight: 255 lb 0.3 oz (115.7 kg)    Fetal Status: Fetal Heart Rate (bpm): 140 Fundal Height: 30 cm Movement: Present     General:  Alert, oriented and cooperative. Patient is in no acute distress.  Skin: Skin is warm and dry. No rash noted.   Cardiovascular: Normal heart rate noted  Respiratory: Normal respiratory effort, no problems with respiration noted  Abdomen: Soft, gravid, appropriate for gestational age.  Pain/Pressure: Absent     Pelvic: Cervical exam deferred        Extremities: Normal range of motion.     Mental Status: Normal mood and affect. Normal behavior. Normal judgment and thought content.   Assessment and Plan:  Pregnancy: G1P0 at [redacted]w[redacted]d 1. Supervision of high risk pregnancy in second trimester 28 wk labs  today + TDaP - Glucose Tolerance, 2 Hours w/1 Hour - RPR - CBC - HIV Antibody (routine testing w rflx) - Tdap vaccine greater than or equal to 7yo IM  2. Hypothyroidism affecting pregnancy in third trimester Repeat labs q trimester - TSH  3. Obesity affecting pregnancy, antepartum Advised about weight gain, reports she has started walking daily--up to 35 # weight gain this pregnancy - aspirin EC 81 MG tablet; Take 1 tablet (81 mg total) by mouth daily. Take after 12 weeks for prevention of preeclampsia later in pregnancy  Dispense: 300 tablet; Refill: 2  4. PCOS (polycystic ovarian syndrome)   Preterm labor symptoms and general obstetric precautions including but not limited to vaginal bleeding, contractions, leaking of fluid and fetal movement were reviewed in detail with the patient. Please refer to After Visit Summary for other counseling recommendations.   Return in 2 weeks (on 01/01/2020) for virtual.  Future Appointments  Date Time Provider Department Center  01/01/2020  3:30 PM Tereso Newcomer, MD [facility]  01/09/2020 11:15 AM [facility] NURSE [facility]  01/09/2020 11:15 AM [facility]    Reva Bores, MD

## 2019-12-18 NOTE — Patient Instructions (Signed)

## 2019-12-19 LAB — GLUCOSE TOLERANCE, 2 HOURS W/ 1HR
Glucose, 1 hour: 120 mg/dL (ref 65–179)
Glucose, 2 hour: 101 mg/dL (ref 65–152)
Glucose, Fasting: 83 mg/dL (ref 65–91)

## 2019-12-19 LAB — TSH: TSH: 5.15 u[IU]/mL — ABNORMAL HIGH (ref 0.450–4.500)

## 2019-12-19 LAB — HIV ANTIBODY (ROUTINE TESTING W REFLEX): HIV Screen 4th Generation wRfx: NONREACTIVE

## 2019-12-19 LAB — RPR: RPR Ser Ql: NONREACTIVE

## 2019-12-20 ENCOUNTER — Other Ambulatory Visit: Payer: Self-pay | Admitting: Family Medicine

## 2019-12-20 DIAGNOSIS — E034 Atrophy of thyroid (acquired): Secondary | ICD-10-CM

## 2019-12-20 MED ORDER — LEVOTHYROXINE SODIUM 100 MCG PO TABS: 100.0000 ug | ORAL_TABLET | Freq: Every day | ORAL | 1 refills | Status: DC

## 2020-01-01 ENCOUNTER — Other Ambulatory Visit: Payer: Self-pay

## 2020-01-01 ENCOUNTER — Telehealth (INDEPENDENT_AMBULATORY_CARE_PROVIDER_SITE_OTHER): Payer: Medicaid Other | Admitting: Obstetrics & Gynecology

## 2020-01-01 VITALS — BP 148/73 | HR 92

## 2020-01-01 DIAGNOSIS — O0993 Supervision of high risk pregnancy, unspecified, third trimester: Secondary | ICD-10-CM

## 2020-01-01 DIAGNOSIS — Z3A30 30 weeks gestation of pregnancy: Secondary | ICD-10-CM

## 2020-01-01 DIAGNOSIS — E039 Hypothyroidism, unspecified: Secondary | ICD-10-CM

## 2020-01-01 DIAGNOSIS — O99210 Obesity complicating pregnancy, unspecified trimester: Secondary | ICD-10-CM

## 2020-01-01 DIAGNOSIS — O163 Unspecified maternal hypertension, third trimester: Secondary | ICD-10-CM

## 2020-01-01 DIAGNOSIS — O99283 Endocrine, nutritional and metabolic diseases complicating pregnancy, third trimester: Secondary | ICD-10-CM

## 2020-01-01 NOTE — Progress Notes (Signed)
OBSTETRICS PRENATAL VIRTUAL VISIT ENCOUNTER NOTE  Provider location: [facility]   I connected with Jenna S Ebling on 01/01/20 at  3:30 PM EDT by MyChart Video Encounter at home and verified that I am speaking with the correct person using two identifiers.   I discussed the limitations, risks, security and privacy concerns of performing an evaluation and management service virtually and the availability of in person appointments. I also discussed with the patient that there may be a patient responsible charge related to this service. The patient expressed understanding and agreed to proceed. Subjective:  Jenna S Arave is a 19 y.o. G1P0 at [redacted]w[redacted]d being seen today for ongoing prenatal care.  She is currently monitored for the following issues for this high-risk pregnancy and has Obesity; Prediabetes; Hypothyroidism, acquired, autoimmune; Dyspepsia; Goiter; Adjustment reaction; Other allergic rhinitis; Hidradenitis suppurativa; Acanthosis nigricans; PCOS (polycystic ovarian syndrome); Supervision of high-risk pregnancy; Hypothyroidism affecting pregnancy; Maternal morbid obesity, antepartum (HCC); Nausea/vomiting in pregnancy; Chlamydia infection affecting pregnancy in first trimester; Hypertension in pregnancy, antepartum; and Abnormal glucose tolerance test (GTT) during pregnancy, antepartum on their problem list.  Patient reports no complaints. Patient denies any headaches, visual symptoms, RUQ/epigastric pain or other concerning symptoms.  Contractions: Not present. Vag. Bleeding: None.  Movement: Present. Denies any leaking of fluid.   The following portions of the patient's history were reviewed and updated as appropriate: allergies, current medications, past family history, past medical history, past social history, past surgical history and problem list.   Objective:   Vitals:   01/01/20 1453  BP: (!) 148/73  Pulse: 92    Fetal Status:      Movement: Present     General:  Alert, oriented and cooperative. Patient is in no acute distress.  Respiratory: Normal respiratory effort, no problems with respiration noted  Mental Status: Normal mood and affect. Normal behavior. Normal judgment and thought content.  Rest of physical exam deferred due to type of encounter  Imaging: [place] MFM OB FOLLOW UP  Result Date: 12/12/2019 ----------------------------------------------------------------------  OBSTETRICS REPORT                         (Signed Final 12/12/2019 03:25 pm) ---------------------------------------------------------------------- Patient Info  ID #:        [id number]                          D.O.B.:  [birth year] (19 yrs)  Name:        Jenna Benson           Visit Date: 12/11/2019 03:24 pm ---------------------------------------------------------------------- Performed By  Attending:         Lin Landsman      Ref. Address:      [address]                     [address]                                        [address]  Performed By:      Emeline Darling BS,      [facility]  Referred By:       [facility] ---------------------------------------------------------------------- Orders  #   Description                          Code         Ordered By  1   [place] MFM OB FOLLOW UP                  [id number]     Noralee Space ----------------------------------------------------------------------  #   Order #                    Accession #                 Episode #  1   [id number]                  [id number]                  [id number] ---------------------------------------------------------------------- Indications  Maternal morbid obesity (pregravid BMI 38)      O99.210 E66.01  Antenatal follow-up for nonvisualized fetal     Z36.2  anatomy  [redacted] weeks gestation of pregnancy                 Z3A.27  Hypothyroid (on synthroid)                      O99.280  E03.9  Negative AFP(Negative MaterniT21) ---------------------------------------------------------------------- Vital Signs  Weight (lb):  248                               Height:        5'3"  BMI:          43.93 ---------------------------------------------------------------------- Fetal Evaluation  Num Of Fetuses:          1  Fetal Heart Rate(bpm):   142  Cardiac Activity:        Observed  Presentation:            Cephalic  Placenta:                Anterior  P. Cord Insertion:       Previously Visualized  Amniotic Fluid  AFI FV:      Within normal limits                              Largest Pocket(cm)                              5. ---------------------------------------------------------------------- Biometry  BPD:        71   mm     G. Age:  28w 4d         85  %    CI:         75.81  %    70 - 86                                                           FL/HC:       19.9  %  18.6 - 20.4  HC:      258.5   mm     G. Age:  28w 1d         59  %    HC/AC:       1.07       1.05 - 1.21  AC:      241.4   mm     G. Age:  28w 3d         82  %    FL/BPD:      72.4  %    71 - 87  FL:       51.4   mm     G. Age:  27w 3d         50  %    FL/AC:       21.3  %    20 - 24  Est. FW:    1171   gm      2 lb 9 oz     81  % ---------------------------------------------------------------------- OB History  Gravidity:     1         Term:  0          Prem:  0        SAB:   0  TOP:           0       Ectopic: 0 ---------------------------------------------------------------------- Gestational Age  LMP:            29w 3d       Date:  05/19/19                   EDD:  02/23/20  U/S Today:      28w 1d                                         EDD:  03/03/20  Best:           27w 0d    Det. By:  Marcella Dubs           EDD:  03/11/20                                      (08/01/19) ---------------------------------------------------------------------- Anatomy  Cranium:                Appears normal         LVOT:                   Not well  visualized  Cavum:                  Appears normal         Aortic Arch:            Previously seen  Ventricles:             Appears normal         Ductal Arch:            Previously seen  Choroid Plexus:         Previously seen        Diaphragm:              Appears normal  Cerebellum:  Appears normal         Stomach:                Appears normal, left                                                                         sided  Posterior Fossa:        Previously seen        Abdomen:                Appears normal  Nuchal Fold:            Previously seen        Abdominal Wall:         Previously seen  Face:                   Orbits and profile     Cord Vessels:           Appears normal (3                          previously seen                                vessel cord)  Lips:                   Previously seen        Kidneys:                Appear normal  Palate:                 Not well visualized    Bladder:                Appears normal  Thoracic:               Appears normal         Spine:                  Previously seen  Heart:                  Appears normal         Upper Extremities:      Previously seen                          (4CH, axis, and situs)  RVOT:                   Not well visualized    Lower Extremities:      Previously seen  Other:   Parents do not wish to know sex of fetus. Open hands, Heels, and 5th           digit visualized. Technically difficult due to maternal habitus and fetal           position. ---------------------------------------------------------------------- Cervix Uterus Adnexa  Cervix  Not visualized (advanced GA >24wks) ---------------------------------------------------------------------- Impression  Follow up growth given elevated BMI and hypothyroidism  Normal interval growth with measurements consistent with  dates.  Again suboptimal views of  the fetal anatomy was again seen  secondary to fetal position.  ---------------------------------------------------------------------- Recommendations  Follow up growth 4 weeks. ----------------------------------------------------------------------               Sander Nephew, MD Electronically Signed Final Report   12/12/2019 03:25 pm ----------------------------------------------------------------------   Assessment and Plan:  Pregnancy: G1P0 at [redacted]w[redacted]d 1. Hypertension during pregnancy in third trimester Elevated BP at home. Had a few other elevated BP, first instance at 18 weeks (Babyscripts cuff).  Will come in tomorrow for office BP check.  If elevated, will check labs.  Scheduled for ultrasound on 01/09/20, may need BPP given other co-morbidities.  Preeclampsia precautions reviewed.  Continue close monitoring, recommend in office BP checks.  Discussed possible need for earlier delivery. - [place] MFM FETAL BPP WO NON STRESS; Future - CBC; Future - Comprehensive metabolic panel; Future - Protein / creatinine ratio, urine; Future  2. Maternal morbid obesity, antepartum (HCC) TWG 35 lb, continue close monitoring  3. Hypothyroidism affecting pregnancy in third trimester Medication increased after elevated TSH on 12/18/19, recheck levels in about 4 weeks from now.  4. [redacted] weeks gestation of pregnancy 5. Supervision of high risk pregnancy in third trimester Preterm labor symptoms and general obstetric precautions including but not limited to vaginal bleeding, contractions, leaking of fluid and fetal movement were reviewed in detail with the patient. I discussed the assessment and treatment plan with the patient. The patient was provided an opportunity to ask questions and all were answered. The patient agreed with the plan and demonstrated an understanding of the instructions. The patient was advised to call back or seek an in-person office evaluation/go to [facility] at [facility] for any urgent or concerning symptoms. Please refer to After Visit  Summary for other counseling recommendations.   I provided 15 minutes of face-to-face time during this encounter.  Return in about 1 day (around 01/02/2020) for BP check.  In person office OB visit in one week..  Future Appointments  Date Time Provider [place]  01/02/2020  1:30 PM [facility] NURSE [facility]  01/09/2020 11:15 AM [facility] NURSE [facility]  01/09/2020 11:15 AM [facility] US2 [facility]  01/14/2020  1:45 PM Woodroe Mode, MD [facility]    Verita Schneiders, MD Center for [facility] [place] [facility]

## 2020-01-01 NOTE — Progress Notes (Signed)
Virtual Visit via Telephone Note  I connected with Jenna Benson on 01/01/20 at  3:30 PM EDT by telephone and verified that I am speaking with the correct person using two identifiers.  Unable to check BP during initial call  Pt states no complaints today  Normal 2 gtt labs

## 2020-01-01 NOTE — Patient Instructions (Addendum)
Return to office for any scheduled appointments. Call the office or go to the MAU at [facility] [place] if:  You begin to have strong, frequent contractions  Your water breaks.  Sometimes it is a big gush of fluid, sometimes it is just a trickle that keeps getting your panties wet or running down your legs  You have vaginal bleeding.  It is normal to have a small amount of spotting if your cervix was checked.   You do not feel your baby moving like normal.  If you do not, get something to eat and drink and lay down and focus on feeling your baby move.   If your baby is still not moving like normal, you should call the office or go to MAU.  Any other obstetric concerns.   Hypertension During Pregnancy High blood pressure (hypertension) is when the force of blood pumping through the arteries is too strong. Arteries are blood vessels that carry blood from the heart throughout the body. Hypertension during pregnancy can be mild or severe. Severe hypertension during pregnancy (preeclampsia) is a medical emergency that requires prompt evaluation and treatment. Different types of hypertension can happen during pregnancy. These include:  Chronic hypertension. This happens when you had high blood pressure before you became pregnant, and it continues during the pregnancy. Hypertension that develops before you are [redacted] weeks pregnant and continues during the pregnancy is also called chronic hypertension. If you have chronic hypertension, it will not go away after you have your baby. You will need follow-up visits with your health care provider after you have your baby. Your doctor may want you to keep taking medicine for your blood pressure.  Gestational hypertension. This is hypertension that develops after the 20th week of pregnancy. Gestational hypertension usually goes away after you have your baby, but your health care provider will need to monitor your blood pressure to make sure  that it is getting better.  Preeclampsia. This is severe hypertension during pregnancy. This can cause serious complications for you and your baby and can also cause complications for you after the delivery of your baby.  Postpartum preeclampsia. You may develop severe hypertension after giving birth. This usually occurs within 48 hours after childbirth but may occur up to 6 weeks after giving birth. This is rare. How does this affect me? Women who have hypertension during pregnancy have a greater chance of developing hypertension later in life or during future pregnancies. In some cases, hypertension during pregnancy can cause serious complications, such as:  Stroke.  Heart attack.  Injury to other organs, such as kidneys, lungs, or liver.  Preeclampsia.  Convulsions or seizures.  Placental abruption. How does this affect my baby? Hypertension during pregnancy can affect your baby. Your baby may:  Be born early (prematurely).  Not weigh as much as he or she should at birth (low birth weight).  Not tolerate labor well, leading to an unplanned cesarean delivery. What are the risks? There are certain factors that make it more likely for you to develop hypertension during pregnancy. These include:  Having hypertension during a previous pregnancy.  Being overweight.  Being age 35 or older.  Being pregnant for the first time.  Being pregnant with more than one baby.  Becoming pregnant using fertilization methods, such as IVF (in vitro fertilization).  Having other medical problems, such as diabetes, kidney disease, or lupus.  Having a family history of hypertension. What can I do to lower my risk?   The exact cause of hypertension during pregnancy is not known. You may be able to lower your risk by:  Maintaining a healthy weight.  Eating a healthy and balanced diet.  Following your health care provider's instructions about treating any long-term conditions that you had  before becoming pregnant. It is very important to keep all of your prenatal care appointments. Your health care provider will check your blood pressure and make sure that your pregnancy is progressing as expected. If a problem is found, early treatment can prevent complications. How is this treated? Treatment for hypertension during pregnancy varies depending on the type of hypertension you have and how serious it is.  If you were taking medicine for high blood pressure before you became pregnant, talk with your health care provider. You may need to change medicine during pregnancy because some medicines, like ACE inhibitors, may not be considered safe for your baby.  If you have gestational hypertension, your health care provider may order medicine to treat this during pregnancy.  If you are at risk for preeclampsia, your health care provider may recommend that you take a low-dose aspirin during your pregnancy.  If you have severe hypertension, you may need to be hospitalized so you and your baby can be monitored closely. You may also need to be given medicine to lower your blood pressure. This medicine may be given by mouth or through an IV.  In some cases, if your condition gets worse, you may need to deliver your baby early. Follow these instructions at home: Eating and drinking   Drink enough fluid to keep your urine pale yellow.  Avoid caffeine. Lifestyle  Do not use any products that contain nicotine or tobacco, such as cigarettes, e-cigarettes, and chewing tobacco. If you need help quitting, ask your health care provider.  Do not use alcohol or drugs.  Avoid stress as much as possible.  Rest and get plenty of sleep.  Regular exercise can help to reduce your blood pressure. Ask your health care provider what kinds of exercise are best for you. General instructions  Take over-the-counter and prescription medicines only as told by your health care provider.  Keep all prenatal  and follow-up visits as told by your health care provider. This is important. Contact a health care provider if:  You have symptoms that your health care provider told you may require more treatment or monitoring, such as: ? Headaches. ? Nausea or vomiting. ? Abdominal pain. ? Dizziness. ? Light-headedness. Get help right away if:  You have: ? Severe abdominal pain that does not get better with treatment. ? A severe headache that does not get better. ? Vomiting that does not get better. ? Sudden, rapid weight gain. ? Sudden swelling in your hands, ankles, or face. ? Vaginal bleeding. ? Blood in your urine. ? Blurred or double vision. ? Shortness of breath or chest pain. ? Weakness on one side of your body. ? Difficulty speaking.  Your baby is not moving as much as usual. Summary  High blood pressure (hypertension) is when the force of blood pumping through the arteries is too strong.  Hypertension during pregnancy can cause problems for you and your baby.  Treatment for hypertension during pregnancy varies depending on the type of hypertension you have and how serious it is.  Keep all prenatal and follow-up visits as told by your health care provider. This is important. This information is not intended to replace advice given to you by your health care provider.   Make sure you discuss any questions you have with your health care provider. Document Revised: 10/26/2018 Document Reviewed: 08/01/2018 Elsevier Patient Education  2020 [employer]    Preeclampsia and Eclampsia Preeclampsia is a serious condition that may develop during pregnancy. This condition causes high blood pressure and increased protein in your urine along with other symptoms, such as headaches and vision changes. These symptoms may develop as the condition gets worse. Preeclampsia may occur at 20 weeks of pregnancy or later. Diagnosing and treating preeclampsia early is very important. If not treated  early, it can cause serious problems for you and your baby. One problem it can lead to is eclampsia. Eclampsia is a condition that causes muscle jerking or shaking (convulsions or seizures) and other serious problems for the mother. During pregnancy, delivering your baby may be the best treatment for preeclampsia or eclampsia. For most women, preeclampsia and eclampsia symptoms go away after giving birth. In rare cases, a woman may develop preeclampsia after giving birth (postpartum preeclampsia). This usually occurs within 48 hours after childbirth but may occur up to 6 weeks after giving birth. What are the causes? The cause of preeclampsia is not known. What increases the risk? The following risk factors make you more likely to develop preeclampsia:  Being pregnant for the first time.  Having had preeclampsia during a past pregnancy.  Having a family history of preeclampsia.  Having high blood pressure.  Being pregnant with more than one baby.  Being 63 or older.  Being African-American.  Having kidney disease or diabetes.  Having medical conditions such as lupus or blood diseases.  Being very overweight (obese). What are the signs or symptoms? The most common symptoms are:  Severe headaches.  Vision problems, such as blurred or double vision.  Abdominal pain, especially upper abdominal pain. Other symptoms that may develop as the condition gets worse include:  Sudden weight gain.  Sudden swelling of the hands, face, legs, and feet.  Severe nausea and vomiting.  Numbness in the face, arms, legs, and feet.  Dizziness.  Urinating less than usual.  Slurred speech.  Convulsions or seizures. How is this diagnosed? There are no screening tests for preeclampsia. Your health care provider will ask you about symptoms and check for signs of preeclampsia during your prenatal visits. You may also have tests that include:  Checking your blood pressure.  Urine tests to  check for protein. Your health care provider will check for this at every prenatal visit.  Blood tests.  Monitoring your baby's heart rate.  Ultrasound. How is this treated? You and your health care provider will determine the treatment approach that is best for you. Treatment may include:  Having more frequent prenatal exams to check for signs of preeclampsia, if you have an increased risk for preeclampsia.  Medicine to lower your blood pressure.  Staying in the hospital, if your condition is severe. There, treatment will focus on controlling your blood pressure and the amount of fluids in your body (fluid retention).  Taking medicine (magnesium sulfate) to prevent seizures. This may be given as an injection or through an IV.  Taking a low-dose aspirin during your pregnancy.  Delivering your baby early. You may have your labor started with medicine (induced), or you may have a cesarean delivery. Follow these instructions at home: Eating and drinking   Drink enough fluid to keep your urine pale yellow.  Avoid caffeine. Lifestyle  Do not use any products that contain nicotine or tobacco, such as  cigarettes and e-cigarettes. If you need help quitting, ask your health care provider.  Do not use alcohol or drugs.  Avoid stress as much as possible. Rest and get plenty of sleep. General instructions  Take over-the-counter and prescription medicines only as told by your health care provider.  When lying down, lie on your left side. This keeps pressure off your major blood vessels.  When sitting or lying down, raise (elevate) your feet. Try putting some pillows underneath your lower legs.  Exercise regularly. Ask your health care provider what kinds of exercise are best for you.  Keep all follow-up and prenatal visits as told by your health care provider. This is important. How is this prevented? There is no known way of preventing preeclampsia or eclampsia from developing.  However, to lower your risk of complications and detect problems early:  Get regular prenatal care. Your health care provider may be able to diagnose and treat the condition early.  Maintain a healthy weight. Ask your health care provider for help managing weight gain during pregnancy.  Work with your health care provider to manage any long-term (chronic) health conditions you have, such as diabetes or kidney problems.  You may have tests of your blood pressure and kidney function after giving birth.  Your health care provider may have you take low-dose aspirin during your next pregnancy. Contact a health care provider if:  You have symptoms that your health care provider told you may require more treatment or monitoring, such as: ? Headaches. ? Nausea or vomiting. ? Abdominal pain. ? Dizziness. ? Light-headedness. Get help right away if:  You have severe: ? Abdominal pain. ? Headaches that do not get better. ? Dizziness. ? Vision problems. ? Confusion. ? Nausea or vomiting.  You have any of the following: ? A seizure. ? Sudden, rapid weight gain. ? Sudden swelling in your hands, ankles, or face. ? Trouble moving any part of your body. ? Numbness in any part of your body. ? Trouble speaking. ? Abnormal bleeding.  You faint. Summary  Preeclampsia is a serious condition that may develop during pregnancy.  This condition causes high blood pressure and increased protein in your urine along with other symptoms, such as headaches and vision changes.  Diagnosing and treating preeclampsia early is very important. If not treated early, it can cause serious problems for you and your baby.  Get help right away if you have symptoms that your health care provider told you to watch for. This information is not intended to replace advice given to you by your health care provider. Make sure you discuss any questions you have with your health care provider. Document Revised:  03/07/2018 Document Reviewed: 02/09/2016 Elsevier Patient Education  [employer].

## 2020-01-02 ENCOUNTER — Ambulatory Visit (INDEPENDENT_AMBULATORY_CARE_PROVIDER_SITE_OTHER): Payer: Medicaid Other | Admitting: *Deleted

## 2020-01-02 VITALS — BP 125/77 | HR 86

## 2020-01-02 DIAGNOSIS — Z0130 Encounter for examination of blood pressure without abnormal findings: Secondary | ICD-10-CM

## 2020-01-02 DIAGNOSIS — O0993 Supervision of high risk pregnancy, unspecified, third trimester: Secondary | ICD-10-CM

## 2020-01-02 NOTE — Progress Notes (Signed)
Patient was assessed and managed by nursing staff during this encounter. I have reviewed the chart and agree with the documentation and plan. I have also made any necessary editorial changes.  Jaynie Collins, MD 01/02/2020 2:12 PM

## 2020-01-02 NOTE — Progress Notes (Signed)
Here for BP check.   BP in office today was 125/77 HR 86.   No labs drawn today due to normal BP. Pt will follow  up at next ROB visit.   Scheryl Marten, RN

## 2020-01-09 ENCOUNTER — Ambulatory Visit: Payer: Medicaid Other | Admitting: *Deleted

## 2020-01-09 ENCOUNTER — Ambulatory Visit: Payer: Medicaid Other | Attending: Maternal & Fetal Medicine

## 2020-01-09 ENCOUNTER — Other Ambulatory Visit: Payer: Self-pay

## 2020-01-09 ENCOUNTER — Other Ambulatory Visit: Payer: Self-pay | Admitting: *Deleted

## 2020-01-09 DIAGNOSIS — O98811 Other maternal infectious and parasitic diseases complicating pregnancy, first trimester: Secondary | ICD-10-CM | POA: Insufficient documentation

## 2020-01-09 DIAGNOSIS — O99213 Obesity complicating pregnancy, third trimester: Secondary | ICD-10-CM

## 2020-01-09 DIAGNOSIS — Z3A31 31 weeks gestation of pregnancy: Secondary | ICD-10-CM

## 2020-01-09 DIAGNOSIS — O10919 Unspecified pre-existing hypertension complicating pregnancy, unspecified trimester: Secondary | ICD-10-CM

## 2020-01-09 DIAGNOSIS — E039 Hypothyroidism, unspecified: Secondary | ICD-10-CM

## 2020-01-09 DIAGNOSIS — Z362 Encounter for other antenatal screening follow-up: Secondary | ICD-10-CM | POA: Diagnosis not present

## 2020-01-09 DIAGNOSIS — O99210 Obesity complicating pregnancy, unspecified trimester: Secondary | ICD-10-CM | POA: Diagnosis not present

## 2020-01-09 DIAGNOSIS — O99283 Endocrine, nutritional and metabolic diseases complicating pregnancy, third trimester: Secondary | ICD-10-CM

## 2020-01-09 DIAGNOSIS — A749 Chlamydial infection, unspecified: Secondary | ICD-10-CM

## 2020-01-09 DIAGNOSIS — Z3689 Encounter for other specified antenatal screening: Secondary | ICD-10-CM | POA: Diagnosis not present

## 2020-01-14 ENCOUNTER — Other Ambulatory Visit: Payer: Self-pay

## 2020-01-14 ENCOUNTER — Ambulatory Visit (INDEPENDENT_AMBULATORY_CARE_PROVIDER_SITE_OTHER): Payer: Medicaid Other | Admitting: Obstetrics & Gynecology

## 2020-01-14 VITALS — BP 134/81 | HR 88 | Wt 258.0 lb

## 2020-01-14 DIAGNOSIS — E282 Polycystic ovarian syndrome: Secondary | ICD-10-CM

## 2020-01-14 DIAGNOSIS — O99283 Endocrine, nutritional and metabolic diseases complicating pregnancy, third trimester: Secondary | ICD-10-CM

## 2020-01-14 DIAGNOSIS — R7303 Prediabetes: Secondary | ICD-10-CM

## 2020-01-14 DIAGNOSIS — O0993 Supervision of high risk pregnancy, unspecified, third trimester: Secondary | ICD-10-CM

## 2020-01-14 DIAGNOSIS — Z3A31 31 weeks gestation of pregnancy: Secondary | ICD-10-CM

## 2020-01-14 DIAGNOSIS — O163 Unspecified maternal hypertension, third trimester: Secondary | ICD-10-CM

## 2020-01-14 DIAGNOSIS — Z8742 Personal history of other diseases of the female genital tract: Secondary | ICD-10-CM

## 2020-01-14 DIAGNOSIS — I10 Essential (primary) hypertension: Secondary | ICD-10-CM

## 2020-01-14 DIAGNOSIS — O99213 Obesity complicating pregnancy, third trimester: Secondary | ICD-10-CM

## 2020-01-14 DIAGNOSIS — O169 Unspecified maternal hypertension, unspecified trimester: Secondary | ICD-10-CM

## 2020-01-14 DIAGNOSIS — E039 Hypothyroidism, unspecified: Secondary | ICD-10-CM

## 2020-01-14 NOTE — Patient Instructions (Signed)

## 2020-01-14 NOTE — Progress Notes (Signed)
   PRENATAL VISIT NOTE  Subjective:  Malaysia S Grassel is a 19 y.o. G1P0 at [redacted]w[redacted]d being seen today for ongoing prenatal care.  She is currently monitored for the following issues for this high-risk pregnancy and has Obesity; Prediabetes; Hypothyroidism, acquired, autoimmune; Dyspepsia; Goiter; Adjustment reaction; Other allergic rhinitis; Hidradenitis suppurativa; Acanthosis nigricans; PCOS (polycystic ovarian syndrome); Supervision of high-risk pregnancy; Hypothyroidism affecting pregnancy; Maternal morbid obesity, antepartum (HCC); Nausea/vomiting in pregnancy; Chlamydia infection affecting pregnancy in first trimester; Hypertension in pregnancy, antepartum; and Abnormal glucose tolerance test (GTT) during pregnancy, antepartum on their problem list.  Patient reports Right groin and thigh pressure.  Contractions: Not present. Vag. Bleeding: None.  Movement: Present. Denies leaking of fluid.   The following portions of the patient's history were reviewed and updated as appropriate: allergies, current medications, past family history, past medical history, past social history, past surgical history and problem list.   Objective:   Vitals:   01/14/20 1358  BP: 134/81  Pulse: 88  Weight: 258 lb (117 kg)    Fetal Status: Fetal Heart Rate (bpm): 134   Movement: Present     General:  Alert, oriented and cooperative. Patient is in no acute distress.  Skin: Skin is warm and dry. No rash noted.   Cardiovascular: Normal heart rate noted  Respiratory: Normal respiratory effort, no problems with respiration noted  Abdomen: Soft, gravid, appropriate for gestational age.  Pain/Pressure: Present     Pelvic: Cervical exam deferred        Extremities: Normal range of motion.  Edema: Trace  Mental Status: Normal mood and affect. Normal behavior. Normal judgment and thought content.   Assessment and Plan:  Pregnancy: G1P0 at [redacted]w[redacted]d There are no diagnoses linked to this encounter. Preterm labor  symptoms and general obstetric precautions including but not limited to vaginal bleeding, contractions, leaking of fluid and fetal movement were reviewed in detail with the patient. Please refer to After Visit Summary for other counseling recommendations.   Return in about 2 weeks (around 01/28/2020).  Future Appointments  Date Time Provider Department Center  01/30/2020 11:00 AM Reva Bores, MD [facility]  02/06/2020  2:45 PM [facility] NURSE [facility]  02/06/2020  2:45 PM [facility] US5 [facility]    Scheryl Darter, MD

## 2020-01-30 ENCOUNTER — Ambulatory Visit (INDEPENDENT_AMBULATORY_CARE_PROVIDER_SITE_OTHER): Payer: Medicaid Other | Admitting: Family Medicine

## 2020-01-30 ENCOUNTER — Other Ambulatory Visit: Payer: Self-pay

## 2020-01-30 VITALS — BP 119/78 | HR 76 | Wt 262.5 lb

## 2020-01-30 DIAGNOSIS — E039 Hypothyroidism, unspecified: Secondary | ICD-10-CM

## 2020-01-30 DIAGNOSIS — O99283 Endocrine, nutritional and metabolic diseases complicating pregnancy, third trimester: Secondary | ICD-10-CM

## 2020-01-30 DIAGNOSIS — Z3A34 34 weeks gestation of pregnancy: Secondary | ICD-10-CM

## 2020-01-30 DIAGNOSIS — O10013 Pre-existing essential hypertension complicating pregnancy, third trimester: Secondary | ICD-10-CM

## 2020-01-30 DIAGNOSIS — O0993 Supervision of high risk pregnancy, unspecified, third trimester: Secondary | ICD-10-CM

## 2020-01-30 DIAGNOSIS — O10019 Pre-existing essential hypertension complicating pregnancy, unspecified trimester: Secondary | ICD-10-CM

## 2020-01-30 NOTE — Progress Notes (Signed)
   PRENATAL VISIT NOTE  Subjective:  Jenna Benson is a 19 y.o. G1P0 at [redacted]w[redacted]d being seen today for ongoing prenatal care.  She is currently monitored for the following issues for this high-risk pregnancy and has Obesity; Prediabetes; Hypothyroidism, acquired, autoimmune; Dyspepsia; Goiter; Adjustment reaction; Other allergic rhinitis; Hidradenitis suppurativa; Acanthosis nigricans; PCOS (polycystic ovarian syndrome); Supervision of high-risk pregnancy; Hypothyroidism affecting pregnancy; Maternal morbid obesity, antepartum (HCC); Nausea/vomiting in pregnancy; Chlamydia infection affecting pregnancy in first trimester; Hypertension in pregnancy, antepartum; and Abnormal glucose tolerance test (GTT) during pregnancy, antepartum on their problem list.  Patient reports no complaints.  Contractions: Not present. Vag. Bleeding: None.  Movement: Present. Denies leaking of fluid.   The following portions of the patient's history were reviewed and updated as appropriate: allergies, current medications, past family history, past medical history, past social history, past surgical history and problem list.   Objective:   Vitals:   01/30/20 1111  BP: 119/78  Pulse: 76  Weight: 262 lb 8 oz (119.1 kg)    Fetal Status: Fetal Heart Rate (bpm): 134 Fundal Height: 34 cm Movement: Present     General:  Alert, oriented and cooperative. Patient is in no acute distress.  Skin: Skin is warm and dry. No rash noted.   Cardiovascular: Normal heart rate noted  Respiratory: Normal respiratory effort, no problems with respiration noted  Abdomen: Soft, gravid, appropriate for gestational age.  Pain/Pressure: Present     Pelvic: Cervical exam deferred        Extremities: Normal range of motion.  Edema: None  Mental Status: Normal mood and affect. Normal behavior. Normal judgment and thought content.   Assessment and Plan:  Pregnancy: G1P0 at [redacted]w[redacted]d 1. Pre-existing essential hypertension during pregnancy,  antepartum BP is ok on no meds Continue ASA Growth is normal--f/u next week  2. Hypothyroidism affecting pregnancy in third trimester Repeat TSH - TSH  3. Supervision of high risk pregnancy in third trimester Cultures next visit.  Preterm labor symptoms and general obstetric precautions including but not limited to vaginal bleeding, contractions, leaking of fluid and fetal movement were reviewed in detail with the patient. Please refer to After Visit Summary for other counseling recommendations.   Return in 2 weeks (on 02/13/2020) for in person 36 wk cultures.  Future Appointments  Date Time Provider Department Center  02/06/2020  2:45 PM [facility] NURSE [facility]  02/06/2020  2:45 PM [facility] US5 [facility]  02/13/2020  1:30 PM Anyanwu, Jethro Bastos, MD [facility]    Reva Bores, MD

## 2020-01-30 NOTE — Patient Instructions (Signed)

## 2020-01-31 LAB — TSH: TSH: 2.44 u[IU]/mL (ref 0.450–4.500)

## 2020-02-06 ENCOUNTER — Other Ambulatory Visit: Payer: Self-pay

## 2020-02-06 ENCOUNTER — Ambulatory Visit: Payer: Medicaid Other | Attending: Obstetrics and Gynecology

## 2020-02-06 ENCOUNTER — Ambulatory Visit: Payer: Medicaid Other | Admitting: *Deleted

## 2020-02-06 DIAGNOSIS — O98811 Other maternal infectious and parasitic diseases complicating pregnancy, first trimester: Secondary | ICD-10-CM

## 2020-02-06 DIAGNOSIS — O99283 Endocrine, nutritional and metabolic diseases complicating pregnancy, third trimester: Secondary | ICD-10-CM | POA: Diagnosis not present

## 2020-02-06 DIAGNOSIS — Z362 Encounter for other antenatal screening follow-up: Secondary | ICD-10-CM | POA: Diagnosis not present

## 2020-02-06 DIAGNOSIS — O163 Unspecified maternal hypertension, third trimester: Secondary | ICD-10-CM

## 2020-02-06 DIAGNOSIS — Z3A30 30 weeks gestation of pregnancy: Secondary | ICD-10-CM | POA: Diagnosis not present

## 2020-02-06 DIAGNOSIS — Z3A35 35 weeks gestation of pregnancy: Secondary | ICD-10-CM

## 2020-02-06 DIAGNOSIS — E039 Hypothyroidism, unspecified: Secondary | ICD-10-CM | POA: Diagnosis not present

## 2020-02-06 DIAGNOSIS — O99210 Obesity complicating pregnancy, unspecified trimester: Secondary | ICD-10-CM

## 2020-02-06 DIAGNOSIS — O99213 Obesity complicating pregnancy, third trimester: Secondary | ICD-10-CM | POA: Diagnosis not present

## 2020-02-06 DIAGNOSIS — O10019 Pre-existing essential hypertension complicating pregnancy, unspecified trimester: Secondary | ICD-10-CM

## 2020-02-06 DIAGNOSIS — O10919 Unspecified pre-existing hypertension complicating pregnancy, unspecified trimester: Secondary | ICD-10-CM | POA: Diagnosis not present

## 2020-02-06 DIAGNOSIS — A749 Chlamydial infection, unspecified: Secondary | ICD-10-CM | POA: Diagnosis not present

## 2020-02-07 ENCOUNTER — Other Ambulatory Visit: Payer: Self-pay | Admitting: *Deleted

## 2020-02-07 DIAGNOSIS — E039 Hypothyroidism, unspecified: Secondary | ICD-10-CM

## 2020-02-13 ENCOUNTER — Encounter: Payer: Self-pay | Admitting: Obstetrics & Gynecology

## 2020-02-13 ENCOUNTER — Ambulatory Visit (INDEPENDENT_AMBULATORY_CARE_PROVIDER_SITE_OTHER): Payer: Medicaid Other | Admitting: Obstetrics & Gynecology

## 2020-02-13 ENCOUNTER — Other Ambulatory Visit (HOSPITAL_COMMUNITY)
Admission: RE | Admit: 2020-02-13 | Discharge: 2020-02-13 | Disposition: A | Discharge status: Home / Self Care | Payer: Medicaid Other | Source: Ambulatory Visit | Attending: Obstetrics & Gynecology | Admitting: Obstetrics & Gynecology

## 2020-02-13 ENCOUNTER — Other Ambulatory Visit: Payer: Self-pay

## 2020-02-13 VITALS — BP 132/79 | HR 98 | Wt 267.0 lb

## 2020-02-13 DIAGNOSIS — O99283 Endocrine, nutritional and metabolic diseases complicating pregnancy, third trimester: Secondary | ICD-10-CM

## 2020-02-13 DIAGNOSIS — O10013 Pre-existing essential hypertension complicating pregnancy, third trimester: Secondary | ICD-10-CM

## 2020-02-13 DIAGNOSIS — N760 Acute vaginitis: Secondary | ICD-10-CM

## 2020-02-13 DIAGNOSIS — Z3A36 36 weeks gestation of pregnancy: Secondary | ICD-10-CM | POA: Insufficient documentation

## 2020-02-13 DIAGNOSIS — O0993 Supervision of high risk pregnancy, unspecified, third trimester: Secondary | ICD-10-CM

## 2020-02-13 DIAGNOSIS — O23593 Infection of other part of genital tract in pregnancy, third trimester: Secondary | ICD-10-CM

## 2020-02-13 DIAGNOSIS — O10019 Pre-existing essential hypertension complicating pregnancy, unspecified trimester: Secondary | ICD-10-CM

## 2020-02-13 DIAGNOSIS — Z01419 Encounter for gynecological examination (general) (routine) without abnormal findings: Secondary | ICD-10-CM | POA: Insufficient documentation

## 2020-02-13 DIAGNOSIS — E039 Hypothyroidism, unspecified: Secondary | ICD-10-CM

## 2020-02-13 NOTE — Patient Instructions (Signed)
Return to office for any scheduled appointments. Call the office or go to the [facility]U at [facility]  [place] if:  You begin to have strong, frequent contractions  Your water breaks.  Sometimes it is a big gush of fluid, sometimes it is just a trickle that keeps getting your panties wet or running down your legs  You have vaginal bleeding.  It is normal to have a small amount of spotting if your cervix was checked.   You do not feel your baby moving like normal.  If you do not, get something to eat and drink and lay down and focus on feeling your baby move.   If your baby is still not moving like normal, you should call the office or go to MAU.  Any other obstetric concerns.   

## 2020-02-13 NOTE — Progress Notes (Signed)
   PRENATAL VISIT NOTE  Subjective:  Jenna Benson is a 19 y.o. G1P0 at [redacted]w[redacted]d being seen today for ongoing prenatal care.  She is currently monitored for the following issues for this high-risk pregnancy and has Obesity; Prediabetes; Hypothyroidism, acquired, autoimmune; Dyspepsia; Goiter; Adjustment reaction; Other allergic rhinitis; Hidradenitis suppurativa; Acanthosis nigricans; PCOS (polycystic ovarian syndrome); Supervision of high-risk pregnancy; Hypothyroidism affecting pregnancy; Maternal morbid obesity, antepartum (HCC); Nausea/vomiting in pregnancy; Chlamydia infection affecting pregnancy in first trimester; Hypertension in pregnancy, antepartum; and Abnormal glucose tolerance test (GTT) during pregnancy, antepartum on their problem list.  Patient reports no complaints.  Contractions: Not present. Vag. Bleeding: None.  Movement: Present. Denies leaking of fluid.   The following portions of the patient's history were reviewed and updated as appropriate: allergies, current medications, past family history, past medical history, past social history, past surgical history and problem list.   Objective:   Vitals:   02/13/20 1342  BP: (!) 132/79  Pulse: 98  Weight: (!) 267 lb (121.1 kg)    Fetal Status: Fetal Heart Rate (bpm): 131   Movement: Present     General:  Alert, oriented and cooperative. Patient is in no acute distress.  Skin: Skin is warm and dry. No rash noted.   Cardiovascular: Normal heart rate noted  Respiratory: Normal respiratory effort, no problems with respiration noted  Abdomen: Soft, gravid, appropriate for gestational age.  Pain/Pressure: Present     Pelvic: Cervical exam deferred        Extremities: Normal range of motion.  Edema: None  Mental Status: Normal mood and affect. Normal behavior. Normal judgment and thought content.   Assessment and Plan:  Pregnancy: G1P0 at [redacted]w[redacted]d 1. Pre-existing essential hypertension during pregnancy, antepartum BP  stable, no medications. Continue scans as per MFM.    2. Hypothyroidism affecting pregnancy in third trimester Normal TSH recetnly. Continue Synthroid.  3. [redacted] weeks gestation of pregnancy 4. Supervision of high risk pregnancy in third trimester Pelvic cultures done today, will follow up results and manage accordingly. - Strep Gp B NAA - Cervicovaginal ancillary only Preterm labor symptoms and general obstetric precautions including but not limited to vaginal bleeding, contractions, leaking of fluid and fetal movement were reviewed in detail with the patient. Please refer to After Visit Summary for other counseling recommendations.   Return in about 1 week (around 02/20/2020) for OFFICE OB Visit.  Future Appointments  Date Time Provider Department Center  02/14/2020  1:30 PM [facility] NURSE [facility]  02/14/2020  1:45 PM [facility] US4 [facility]  02/21/2020  1:30 PM [facility]URSE [facility]  02/21/2020  1:45 PM [facility] US4 [facility]  02/28/2020  1:30 PM [facility] NURSE [facility]  02/28/2020  1:45 PM [facility] US4 [facility]    Jaynie Collins, MD

## 2020-02-14 ENCOUNTER — Ambulatory Visit: Payer: Medicaid Other | Attending: Obstetrics and Gynecology

## 2020-02-14 ENCOUNTER — Ambulatory Visit: Payer: Medicaid Other | Admitting: *Deleted

## 2020-02-14 DIAGNOSIS — O10019 Pre-existing essential hypertension complicating pregnancy, unspecified trimester: Secondary | ICD-10-CM | POA: Insufficient documentation

## 2020-02-14 DIAGNOSIS — O99283 Endocrine, nutritional and metabolic diseases complicating pregnancy, third trimester: Secondary | ICD-10-CM | POA: Insufficient documentation

## 2020-02-14 DIAGNOSIS — O99210 Obesity complicating pregnancy, unspecified trimester: Secondary | ICD-10-CM

## 2020-02-14 DIAGNOSIS — A749 Chlamydial infection, unspecified: Secondary | ICD-10-CM | POA: Diagnosis not present

## 2020-02-14 DIAGNOSIS — O99213 Obesity complicating pregnancy, third trimester: Secondary | ICD-10-CM

## 2020-02-14 DIAGNOSIS — O98811 Other maternal infectious and parasitic diseases complicating pregnancy, first trimester: Secondary | ICD-10-CM | POA: Diagnosis not present

## 2020-02-14 DIAGNOSIS — Z362 Encounter for other antenatal screening follow-up: Secondary | ICD-10-CM

## 2020-02-14 DIAGNOSIS — Z3A36 36 weeks gestation of pregnancy: Secondary | ICD-10-CM

## 2020-02-14 DIAGNOSIS — E039 Hypothyroidism, unspecified: Secondary | ICD-10-CM | POA: Insufficient documentation

## 2020-02-14 LAB — CERVICOVAGINAL ANCILLARY ONLY
Bacterial Vaginitis (gardnerella): POSITIVE — AB
Candida Glabrata: NEGATIVE
Candida Vaginitis: POSITIVE — AB
Chlamydia: NEGATIVE
Comment: NEGATIVE
Comment: NEGATIVE
Comment: NEGATIVE
Comment: NEGATIVE
Comment: NEGATIVE
Comment: NORMAL
Neisseria Gonorrhea: NEGATIVE
Trichomonas: NEGATIVE

## 2020-02-15 LAB — STREP GP B NAA: Strep Gp B NAA: NEGATIVE

## 2020-02-15 MED ORDER — METRONIDAZOLE 500 MG PO TABS: 500.0000 mg | ORAL_TABLET | Freq: Two times a day (BID) | ORAL | 0 refills | Status: AC

## 2020-02-15 MED ORDER — TERCONAZOLE 0.8 % VA CREA: 1.0000 | TOPICAL_CREAM | Freq: Every day | VAGINAL | 0 refills | Status: DC

## 2020-02-15 NOTE — Addendum Note (Signed)
Addended by: Jaynie Collins A on: 02/15/2020 11:43 AM   Modules accepted: Orders

## 2020-02-19 ENCOUNTER — Telehealth (HOSPITAL_COMMUNITY): Payer: Self-pay | Admitting: *Deleted

## 2020-02-19 ENCOUNTER — Encounter (HOSPITAL_COMMUNITY): Payer: Self-pay | Admitting: *Deleted

## 2020-02-19 ENCOUNTER — Ambulatory Visit (INDEPENDENT_AMBULATORY_CARE_PROVIDER_SITE_OTHER): Payer: Medicaid Other | Admitting: Obstetrics and Gynecology

## 2020-02-19 ENCOUNTER — Other Ambulatory Visit: Payer: Self-pay

## 2020-02-19 VITALS — BP 127/81 | HR 92 | Wt 271.0 lb

## 2020-02-19 DIAGNOSIS — O0993 Supervision of high risk pregnancy, unspecified, third trimester: Secondary | ICD-10-CM

## 2020-02-19 DIAGNOSIS — E039 Hypothyroidism, unspecified: Secondary | ICD-10-CM

## 2020-02-19 DIAGNOSIS — O99283 Endocrine, nutritional and metabolic diseases complicating pregnancy, third trimester: Secondary | ICD-10-CM

## 2020-02-19 DIAGNOSIS — I10 Essential (primary) hypertension: Secondary | ICD-10-CM

## 2020-02-19 DIAGNOSIS — O99210 Obesity complicating pregnancy, unspecified trimester: Secondary | ICD-10-CM

## 2020-02-19 NOTE — Progress Notes (Signed)
Prenatal Visit Note Date: 02/19/2020 Clinic: [facility]  Subjective:  Jenna Benson is a 19 y.o. G1P0 at [redacted]w[redacted]d being seen today for ongoing prenatal care.  She is currently monitored for the following issues for this high-risk pregnancy and has Obesity; Prediabetes; Hypothyroidism, acquired, autoimmune; Dyspepsia; Goiter; Adjustment reaction; Other allergic rhinitis; Hidradenitis suppurativa; Acanthosis nigricans; PCOS (polycystic ovarian syndrome); Supervision of high-risk pregnancy; Hypothyroidism affecting pregnancy; Maternal morbid obesity, antepartum (HCC); Nausea/vomiting in pregnancy; Chlamydia infection affecting pregnancy in first trimester; and Chronic hypertension on their problem list.  Patient reports no complaints.   Contractions: Not present. Vag. Bleeding: None.  Movement: Present. Denies leaking of fluid.   The following portions of the patient's history were reviewed and updated as appropriate: allergies, current medications, past family history, past medical history, past social history, past surgical history and problem list. Problem list updated.  Objective:   Vitals:   02/19/20 1524  BP: 127/81  Pulse: 92  Weight: 271 lb (122.9 kg)    Fetal Status: Fetal Heart Rate (bpm): 157   Movement: Present     General:  Alert, oriented and cooperative. Patient is in no acute distress.  Skin: Skin is warm and dry. No rash noted.   Cardiovascular: Normal heart rate noted  Respiratory: Normal respiratory effort, no problems with respiration noted  Abdomen: Soft, gravid, appropriate for gestational age. Pain/Pressure: Present     Pelvic:  Cervical exam deferred        Extremities: Normal range of motion.  Edema: None  Mental Status: Normal mood and affect. Normal behavior. Normal judgment and thought content.   Urinalysis:      Assessment and Plan:  Pregnancy: G1P0 at [redacted]w[redacted]d  1. Chronic hypertension Will set up for 39wk IOL. Continue weekly bpp  (has one later this week). Last growth normal  2. Hypothyroidism affecting pregnancy in third trimester Continue synthroid. 7/14 tsh normal.   3. Supervision of high risk pregnancy in third trimester Routine care  4. Maternal morbid obesity, antepartum (HCC)  Term labor symptoms and general obstetric precautions including but not limited to vaginal bleeding, contractions, leaking of fluid and fetal movement were reviewed in detail with the patient. Please refer to After Visit Summary for other counseling recommendations.  Return in about 1 week (around 02/26/2020) for high risk, in person or virtual.   [place] Bing, MD

## 2020-02-19 NOTE — Telephone Encounter (Signed)
Preadmission screen  

## 2020-02-21 ENCOUNTER — Ambulatory Visit: Payer: Medicaid Other | Admitting: *Deleted

## 2020-02-21 ENCOUNTER — Other Ambulatory Visit: Payer: Self-pay

## 2020-02-21 ENCOUNTER — Ambulatory Visit: Payer: Medicaid Other | Attending: Obstetrics and Gynecology

## 2020-02-21 DIAGNOSIS — O99210 Obesity complicating pregnancy, unspecified trimester: Secondary | ICD-10-CM | POA: Insufficient documentation

## 2020-02-21 DIAGNOSIS — I10 Essential (primary) hypertension: Secondary | ICD-10-CM | POA: Diagnosis not present

## 2020-02-21 DIAGNOSIS — O99283 Endocrine, nutritional and metabolic diseases complicating pregnancy, third trimester: Secondary | ICD-10-CM | POA: Diagnosis not present

## 2020-02-21 DIAGNOSIS — O98811 Other maternal infectious and parasitic diseases complicating pregnancy, first trimester: Secondary | ICD-10-CM | POA: Diagnosis not present

## 2020-02-21 DIAGNOSIS — O99213 Obesity complicating pregnancy, third trimester: Secondary | ICD-10-CM | POA: Diagnosis not present

## 2020-02-21 DIAGNOSIS — E039 Hypothyroidism, unspecified: Secondary | ICD-10-CM | POA: Insufficient documentation

## 2020-02-21 DIAGNOSIS — Z3A37 37 weeks gestation of pregnancy: Secondary | ICD-10-CM | POA: Diagnosis not present

## 2020-02-21 DIAGNOSIS — A749 Chlamydial infection, unspecified: Secondary | ICD-10-CM | POA: Insufficient documentation

## 2020-02-27 ENCOUNTER — Other Ambulatory Visit: Payer: Self-pay | Admitting: Advanced Practice Midwife

## 2020-02-28 ENCOUNTER — Ambulatory Visit: Payer: Medicaid Other | Admitting: *Deleted

## 2020-02-28 ENCOUNTER — Other Ambulatory Visit: Payer: Self-pay

## 2020-02-28 ENCOUNTER — Ambulatory Visit (INDEPENDENT_AMBULATORY_CARE_PROVIDER_SITE_OTHER): Payer: Medicaid Other | Admitting: Obstetrics and Gynecology

## 2020-02-28 ENCOUNTER — Other Ambulatory Visit: Payer: Self-pay | Admitting: Obstetrics

## 2020-02-28 ENCOUNTER — Ambulatory Visit: Payer: Medicaid Other | Attending: Obstetrics and Gynecology

## 2020-02-28 VITALS — BP 127/84 | HR 112 | Wt 276.0 lb

## 2020-02-28 DIAGNOSIS — O99210 Obesity complicating pregnancy, unspecified trimester: Secondary | ICD-10-CM | POA: Diagnosis not present

## 2020-02-28 DIAGNOSIS — A749 Chlamydial infection, unspecified: Secondary | ICD-10-CM

## 2020-02-28 DIAGNOSIS — O10919 Unspecified pre-existing hypertension complicating pregnancy, unspecified trimester: Secondary | ICD-10-CM | POA: Insufficient documentation

## 2020-02-28 DIAGNOSIS — E039 Hypothyroidism, unspecified: Secondary | ICD-10-CM

## 2020-02-28 DIAGNOSIS — Z3A38 38 weeks gestation of pregnancy: Secondary | ICD-10-CM | POA: Diagnosis not present

## 2020-02-28 DIAGNOSIS — I10 Essential (primary) hypertension: Secondary | ICD-10-CM | POA: Diagnosis not present

## 2020-02-28 DIAGNOSIS — O98811 Other maternal infectious and parasitic diseases complicating pregnancy, first trimester: Secondary | ICD-10-CM

## 2020-02-28 DIAGNOSIS — O99283 Endocrine, nutritional and metabolic diseases complicating pregnancy, third trimester: Secondary | ICD-10-CM | POA: Insufficient documentation

## 2020-02-28 DIAGNOSIS — O10013 Pre-existing essential hypertension complicating pregnancy, third trimester: Secondary | ICD-10-CM

## 2020-02-28 DIAGNOSIS — O99213 Obesity complicating pregnancy, third trimester: Secondary | ICD-10-CM

## 2020-02-28 DIAGNOSIS — Z6841 Body Mass Index (BMI) 40.0 and over, adult: Secondary | ICD-10-CM

## 2020-02-28 DIAGNOSIS — O0993 Supervision of high risk pregnancy, unspecified, third trimester: Secondary | ICD-10-CM

## 2020-02-28 NOTE — Progress Notes (Signed)
Prenatal Visit Note Date: 02/28/2020 Clinic: [facility]  Subjective:  Jenna Benson is a 19 y.o. G1P0 at [redacted]w[redacted]d being seen today for ongoing prenatal care.  She is currently monitored for the following issues for this high-risk pregnancy and has Obesity; Prediabetes; Hypothyroidism, acquired, autoimmune; Dyspepsia; Goiter; Adjustment reaction; Other allergic rhinitis; Hidradenitis suppurativa; Acanthosis nigricans; PCOS (polycystic ovarian syndrome); Supervision of high-risk pregnancy; Hypothyroidism affecting pregnancy; Maternal morbid obesity, antepartum (HCC); Nausea/vomiting in pregnancy; Chlamydia infection affecting pregnancy in first trimester; and Chronic hypertension on their problem list.  Patient reports no complaints.   Contractions: Irregular. Vag. Bleeding: None.  Movement: Present. Denies leaking of fluid.   The following portions of the patient's history were reviewed and updated as appropriate: allergies, current medications, past family history, past medical history, past social history, past surgical history and problem list. Problem list updated.  Objective:   Vitals:   02/28/20 1051  BP: 127/84  Pulse: (!) 112  Weight: 276 lb (125.2 kg)    Fetal Status: Fetal Heart Rate (bpm): 147   Movement: Present     General:  Alert, oriented and cooperative. Patient is in no acute distress.  Skin: Skin is warm and dry. No rash noted.   Cardiovascular: Normal heart rate noted  Respiratory: Normal respiratory effort, no problems with respiration noted  Abdomen: Soft, gravid, appropriate for gestational age. Pain/Pressure: Present     Pelvic:  Cervical exam deferred        Extremities: Normal range of motion.  Edema: Trace  Mental Status: Normal mood and affect. Normal behavior. Normal judgment and thought content.   Urinalysis:      Assessment and Plan:  Pregnancy: G1P0 at [redacted]w[redacted]d  1. Chronic hypertension Doing well on no meds. Continue low dose  ASA  2. Hypothyroidism affecting pregnancy in third trimester Continue synthroid  3. Supervision of high risk pregnancy in third trimester Routine care. Set up for IOL in a few days. D/w her re: process. Has rpt bpp today  4. Maternal morbid obesity, antepartum (HCC)  5. BMI 40.0-44.9, adult (HCC)  6. [redacted] weeks gestation of pregnancy  Term labor symptoms and general obstetric precautions including but not limited to vaginal bleeding, contractions, leaking of fluid and fetal movement were reviewed in detail with the patient. Please refer to After Visit Summary for other counseling recommendations.  Return if symptoms worsen or fail to improve.   [place] Bing, MD

## 2020-02-28 NOTE — Procedures (Signed)
[place] S Mccathern July 10, 2001 [redacted]w[redacted]d  Fetus A Non-Stress Test Interpretation for 02/28/20  Indication: Unsatisfactory BPP  Fetal Heart Rate A Mode: External Baseline Rate (A): 125 bpm Variability: Moderate Accelerations: 15 x 15 Decelerations: None Multiple birth?: No  Uterine Activity Mode: Palpation, Toco Contraction Frequency (min): none noted Resting Tone Palpated: Relaxed Resting Time: Adequate  Interpretation (Fetal Testing) Nonstress Test Interpretation: Reactive Comments: Reviewed tracing with Dr. Judeth Cornfield

## 2020-03-03 ENCOUNTER — Other Ambulatory Visit (HOSPITAL_COMMUNITY)
Admission: RE | Admit: 2020-03-03 | Discharge: 2020-03-03 | Disposition: A | Discharge status: Home / Self Care | Payer: Medicaid Other | Source: Ambulatory Visit | Attending: Family Medicine | Admitting: Family Medicine

## 2020-03-03 DIAGNOSIS — Z01812 Encounter for preprocedural laboratory examination: Secondary | ICD-10-CM | POA: Insufficient documentation

## 2020-03-03 DIAGNOSIS — Z20822 Contact with and (suspected) exposure to covid-19: Secondary | ICD-10-CM | POA: Insufficient documentation

## 2020-03-03 LAB — SARS CORONAVIRUS 2 (TAT 6-24 HRS): SARS Coronavirus 2: NEGATIVE

## 2020-03-04 ENCOUNTER — Encounter (HOSPITAL_COMMUNITY): Payer: Self-pay | Admitting: Obstetrics and Gynecology

## 2020-03-04 ENCOUNTER — Inpatient Hospital Stay (HOSPITAL_COMMUNITY)
Admission: AD | Admit: 2020-03-04 | Discharge: 2020-03-07 | DRG: 807 | Disposition: A | Discharge status: Home / Self Care | Payer: Medicaid Other | Attending: Obstetrics and Gynecology | Admitting: Obstetrics and Gynecology

## 2020-03-04 ENCOUNTER — Other Ambulatory Visit: Payer: Self-pay

## 2020-03-04 ENCOUNTER — Inpatient Hospital Stay (HOSPITAL_COMMUNITY): Payer: Medicaid Other

## 2020-03-04 DIAGNOSIS — E039 Hypothyroidism, unspecified: Secondary | ICD-10-CM | POA: Diagnosis not present

## 2020-03-04 DIAGNOSIS — O0990 Supervision of high risk pregnancy, unspecified, unspecified trimester: Secondary | ICD-10-CM

## 2020-03-04 DIAGNOSIS — O24420 Gestational diabetes mellitus in childbirth, diet controlled: Secondary | ICD-10-CM | POA: Diagnosis not present

## 2020-03-04 DIAGNOSIS — O99284 Endocrine, nutritional and metabolic diseases complicating childbirth: Secondary | ICD-10-CM | POA: Diagnosis not present

## 2020-03-04 DIAGNOSIS — O1002 Pre-existing essential hypertension complicating childbirth: Principal | ICD-10-CM | POA: Diagnosis present

## 2020-03-04 DIAGNOSIS — O9952 Diseases of the respiratory system complicating childbirth: Secondary | ICD-10-CM | POA: Diagnosis not present

## 2020-03-04 DIAGNOSIS — E063 Autoimmune thyroiditis: Secondary | ICD-10-CM | POA: Diagnosis present

## 2020-03-04 DIAGNOSIS — O99210 Obesity complicating pregnancy, unspecified trimester: Secondary | ICD-10-CM

## 2020-03-04 DIAGNOSIS — I10 Essential (primary) hypertension: Secondary | ICD-10-CM

## 2020-03-04 DIAGNOSIS — O99280 Endocrine, nutritional and metabolic diseases complicating pregnancy, unspecified trimester: Secondary | ICD-10-CM | POA: Diagnosis present

## 2020-03-04 DIAGNOSIS — Z3A39 39 weeks gestation of pregnancy: Secondary | ICD-10-CM | POA: Diagnosis not present

## 2020-03-04 DIAGNOSIS — J45909 Unspecified asthma, uncomplicated: Secondary | ICD-10-CM | POA: Diagnosis not present

## 2020-03-04 DIAGNOSIS — O99214 Obesity complicating childbirth: Secondary | ICD-10-CM | POA: Diagnosis not present

## 2020-03-04 DIAGNOSIS — Z20822 Contact with and (suspected) exposure to covid-19: Secondary | ICD-10-CM | POA: Diagnosis not present

## 2020-03-04 DIAGNOSIS — E282 Polycystic ovarian syndrome: Secondary | ICD-10-CM | POA: Diagnosis present

## 2020-03-04 DIAGNOSIS — E669 Obesity, unspecified: Secondary | ICD-10-CM | POA: Diagnosis present

## 2020-03-04 DIAGNOSIS — A749 Chlamydial infection, unspecified: Secondary | ICD-10-CM | POA: Diagnosis present

## 2020-03-04 DIAGNOSIS — R7303 Prediabetes: Secondary | ICD-10-CM | POA: Diagnosis present

## 2020-03-04 DIAGNOSIS — O98811 Other maternal infectious and parasitic diseases complicating pregnancy, first trimester: Secondary | ICD-10-CM

## 2020-03-04 DIAGNOSIS — O10919 Unspecified pre-existing hypertension complicating pregnancy, unspecified trimester: Secondary | ICD-10-CM

## 2020-03-04 LAB — PROTEIN / CREATININE RATIO, URINE
Creatinine, Urine: 174.51 mg/dL
Protein Creatinine Ratio: 0.15 mg/mg{Cre} (ref 0.00–0.15)
Total Protein, Urine: 27 mg/dL

## 2020-03-04 LAB — CBC
HCT: 36.8 % (ref 36.0–46.0)
Hemoglobin: 11.6 g/dL — ABNORMAL LOW (ref 12.0–15.0)
MCH: 26 pg (ref 26.0–34.0)
MCHC: 31.5 g/dL (ref 30.0–36.0)
MCV: 82.3 fL (ref 80.0–100.0)
Platelets: 489 10*3/uL — ABNORMAL HIGH (ref 150–400)
RBC: 4.47 MIL/uL (ref 3.87–5.11)
RDW: 14.3 % (ref 11.5–15.5)
WBC: 13.5 10*3/uL — ABNORMAL HIGH (ref 4.0–10.5)
nRBC: 0 % (ref 0.0–0.2)

## 2020-03-04 LAB — COMPREHENSIVE METABOLIC PANEL
ALT: 10 U/L (ref 0–44)
AST: 14 U/L — ABNORMAL LOW (ref 15–41)
Albumin: 2.3 g/dL — ABNORMAL LOW (ref 3.5–5.0)
Alkaline Phosphatase: 148 U/L — ABNORMAL HIGH (ref 38–126)
Anion gap: 9 (ref 5–15)
BUN: 6 mg/dL (ref 6–20)
CO2: 20 mmol/L — ABNORMAL LOW (ref 22–32)
Calcium: 9.1 mg/dL (ref 8.9–10.3)
Chloride: 106 mmol/L (ref 98–111)
Creatinine, Ser: 0.49 mg/dL (ref 0.44–1.00)
GFR calc Af Amer: >60 mL/min (ref >60)
GFR calc non Af Amer: >60 mL/min (ref >60)
Glucose, Bld: 88 mg/dL (ref 70–99)
Potassium: 3.6 mmol/L (ref 3.5–5.1)
Sodium: 135 mmol/L (ref 135–145)
Total Bilirubin: 0.4 mg/dL (ref 0.3–1.2)
Total Protein: 6.3 g/dL — ABNORMAL LOW (ref 6.5–8.1)

## 2020-03-04 LAB — TYPE AND SCREEN
ABO/RH(D): B POS
Antibody Screen: NEGATIVE

## 2020-03-04 MED ORDER — MISOPROSTOL 25 MCG QUARTER TABLET: 25.0000 ug | ORAL_TABLET | ORAL | Status: DC | PRN

## 2020-03-04 MED ORDER — SOD CITRATE-CITRIC ACID 500-334 MG/5ML PO SOLN: 30.0000 mL | ORAL | Status: DC | PRN

## 2020-03-04 MED ORDER — LEVOTHYROXINE SODIUM 100 MCG PO TABS
100.0000 ug | ORAL_TABLET | Freq: Every day | ORAL | Status: DC
  Filled 2020-03-04: qty 1

## 2020-03-04 MED ORDER — MISOPROSTOL 50MCG HALF TABLET
50.0000 ug | ORAL_TABLET | ORAL | Status: DC | PRN
  Administered 2020-03-04: 50 ug via BUCCAL

## 2020-03-04 MED ORDER — OXYTOCIN-SODIUM CHLORIDE 30-0.9 UT/500ML-% IV SOLN
2.5000 [IU]/h | INTRAVENOUS | Status: DC
  Filled 2020-03-04 (×2): qty 500

## 2020-03-04 MED ORDER — TERBUTALINE SULFATE 1 MG/ML IJ SOLN: 0.2500 mg | Freq: Once | INTRAMUSCULAR | Status: DC | PRN

## 2020-03-04 MED ORDER — LACTATED RINGERS IV SOLN: 500.0000 mL | INTRAVENOUS | Status: DC | PRN

## 2020-03-04 MED ORDER — FENTANYL CITRATE (PF) 100 MCG/2ML IJ SOLN
100.0000 ug | INTRAMUSCULAR | Status: DC | PRN
  Administered 2020-03-05 (×2): 100 ug via INTRAVENOUS
  Filled 2020-03-04 (×2): qty 2

## 2020-03-04 MED ORDER — LEVOTHYROXINE SODIUM 100 MCG PO TABS
100.0000 ug | ORAL_TABLET | Freq: Every day | ORAL | Status: DC
  Administered 2020-03-05 – 2020-03-06 (×3): 100 ug via ORAL
  Filled 2020-03-04 (×5): qty 1

## 2020-03-04 MED ORDER — ONDANSETRON HCL 4 MG/2ML IJ SOLN: 4.0000 mg | Freq: Four times a day (QID) | INTRAMUSCULAR | Status: DC | PRN

## 2020-03-04 MED ORDER — MISOPROSTOL 50MCG HALF TABLET
50.0000 ug | ORAL_TABLET | ORAL | Status: DC | PRN
  Administered 2020-03-04 – 2020-03-05 (×3): 50 ug via BUCCAL

## 2020-03-04 MED ORDER — LACTATED RINGERS IV SOLN: INTRAVENOUS | Status: DC

## 2020-03-04 MED ORDER — MISOPROSTOL 50MCG HALF TABLET
50.0000 ug | ORAL_TABLET | ORAL | Status: DC | PRN
  Administered 2020-03-04: 50 ug via ORAL
  Filled 2020-03-04 (×4): qty 1

## 2020-03-04 MED ORDER — OXYTOCIN BOLUS FROM INFUSION
333.0000 mL | Freq: Once | INTRAVENOUS | Status: AC
  Administered 2020-03-06: 333 mL via INTRAVENOUS

## 2020-03-04 MED ORDER — LIDOCAINE HCL (PF) 1 % IJ SOLN: 30.0000 mL | INTRAMUSCULAR | Status: DC | PRN

## 2020-03-04 MED ORDER — MISOPROSTOL 50MCG HALF TABLET: 50.0000 ug | ORAL_TABLET | Freq: Once | ORAL | Status: DC

## 2020-03-04 MED ORDER — ACETAMINOPHEN 325 MG PO TABS: 650.0000 mg | ORAL_TABLET | ORAL | Status: DC | PRN

## 2020-03-04 NOTE — Progress Notes (Addendum)
Labor Progress Note [place] S Jury is a 19 y.o. G1P0 at [redacted]w[redacted]d presented for IOL for cHTN S: Patient is doing well.   O:  BP 125/73   Pulse 80   Temp 98.3 F (36.8 C) (Oral)   Resp 16   Ht 5\' 3"  (1.6 m)   Wt 125.1 kg   LMP 05/19/2019 (Exact Date)   BMI 48.87 kg/m  EFM: baseline 140/moderate variability/+accelerations, no decels   CVE: Dilation: 1.5 Effacement (%): 70 Cervical Position: Posterior Station: -3 Presentation: Vertex Exam by:: Dr. [email or web address]   A&P: 20 y.o. G1P0 [redacted]w[redacted]d presenting for IOL for cHTN #Labor: Progressing well. Foley balloon inserted and buccal cytotec   #Pain: Tolerating  #FWB: Cat I #GBS negative #cHTN: last BP 125/73. Not on medication  [redacted]w[redacted]d, DO 12:29 PM  GME ATTESTATION:  I saw and evaluated the patient. I agree with the findings and the plan of care as documented in the resident's note. FB place by me during this exam.  Sabino Dick, DO OB Fellow, Faculty Practice [facility], [facility] 03/04/2020 1:32 PM

## 2020-03-04 NOTE — Progress Notes (Signed)
Labor Progress Note Jenna Benson is a 19 y.o. G1P0 at [redacted]w[redacted]d presented for IOL for cHTN  S: Patient doing well, feeling contractions but they are not painful.   O:  BP 129/78   Pulse 76   Temp 98.4 F (36.9 C) (Oral)   Resp 18   Ht 5\' 3"  (1.6 m)   Wt 125.1 kg   LMP 05/19/2019 (Exact Date)   BMI 48.87 kg/m  EFM: baseline 140/moderate variability/+accelerations, no decels   CVE: Dilation: 2 Effacement (%): 70 Cervical Position: Middle Station: -2 Presentation: Vertex Exam by:: Dr. [email or web address]   A&P: 19 y.o. G1P0 [redacted]w[redacted]d presenting for IOL for cHTN  #Induction of Labor:  Foley balloon inserted at 1230, out at 1600 and now 2/70/-2. Will proceed w third dose of cytotec now.   #cHTN: PEC labs normal. Has been normotensive since admission.   #Pain: Tolerating, epidural prn  #FWB: Cat I #GBS negative    04-07-1979, MD 4:32 PM

## 2020-03-04 NOTE — H&P (Signed)
OBSTETRIC ADMISSION HISTORY AND PHYSICAL  Jenna Benson is a 19 y.o. female G1P0 with IUP at 64w0dby 8 wk [place] for I[place] for cHTN. She reports +FMs, No LOF, no VB, no blurry vision, headaches or peripheral edema, and RUQ pain.  She plans on breast feeding. She is undecided on birth control. [facility]   Dating: By 8wk [place]--->  Estimated Date of Delivery: 03/11/20  Sono:    '@[redacted]w[redacted]d' , CWD, normal anatomy, cephalic presentation, ant placenta, 3018g, 88% EFW   Prenatal History/Complications:  -cHTN - not on meds, on low dose ASA  -Hypothyroid -BMI 49  -chlamydia infection in 1st trimester, diagnosed 08/13/19 w neg TOC on 09/18/19.    Past Medical History: Past Medical History:  Diagnosis Date   Asthma    Hypertension    Hypothyroidism, acquired, autoimmune    Obesity    Pre-diabetes    Thyroiditis, autoimmune     Past Surgical History: Past Surgical History:  Procedure Laterality Date   NO PAST SURGERIES      Obstetrical History: OB History    Gravida  1   Para      Term      Preterm      AB      Living  0     SAB      TAB      Ectopic      Multiple      Live Births              Social History Social History   Socioeconomic History   Marital status: Single    Spouse name: Not on file   Number of children: Not on file   Years of education: Not on file   Highest education level: Not on file  Occupational History   Not on file  Tobacco Use   Smoking status: Never Smoker   Smokeless tobacco: Never Used   Tobacco comment: brother smokes outise of house  Vaping Use   Vaping Use: Never used  Substance and Sexual Activity   Alcohol use: No    Alcohol/week: 0.0 standard drinks   Drug use: No   Sexual activity: Yes    Birth control/protection: None  Other Topics Concern   Not on file  Social History Narrative    Lives with Mom, Dad, 2 sisters.    Social Determinants of  Health   Financial Resource Strain:    Difficulty of Paying Living Expenses:   Food Insecurity:    Worried About RCharity fundraiserin the Last Year:    RArboriculturistin the Last Year:   Transportation Needs:    LFilm/video editor(Medical):    Lack of Transportation (Non-Medical):   Physical Activity:    Days of Exercise per Week:    Minutes of Exercise per Session:   Stress:    Feeling of Stress :   Social Connections:    Frequency of Communication with Friends and Family:    Frequency of Social Gatherings with Friends and Family:    Attends Religious Services:    Active Member of Clubs or Organizations:    Attends CMusic therapist    Marital Status:     Family History: Family History  Problem Relation Age of Onset   Thyroid disease Maternal Aunt    Diabetes Maternal Aunt        type 2   Obesity Maternal  Aunt    Cancer Maternal Aunt        stomach   Obesity Mother    Hypertension Mother    Obesity Father    Cancer Father        prostate   Obesity Maternal Uncle    Obesity Maternal Uncle    Obesity Maternal Grandmother    Obesity Maternal Grandfather    Hypertension Paternal Uncle    Hypertension Paternal Uncle     Allergies: No Known Allergies  Medications Prior to Admission  Medication Sig Dispense Refill Last Dose   aspirin EC 81 MG tablet Take 1 tablet (81 mg total) by mouth daily. Take after 12 weeks for prevention of preeclampsia later in pregnancy 300 tablet 2 03/03/2020 at Unknown time   levothyroxine (SYNTHROID) 100 MCG tablet TAKE 1 TABLET(100 MCG) BY MOUTH DAILY BEFORE BREAKFAST 90 tablet 2 03/03/2020 at Unknown time   Prenatal MV & Min w/FA-DHA (PRENATAL ADULT GUMMY/DHA/FA) 0.4-25 MG CHEW Chew 1 tablet by mouth daily. 30 tablet 6 Past Week at Unknown time   albuterol (PROAIR HFA) 108 (90 Base) MCG/ACT inhaler Inhale 2 puffs into the lungs every 6 (six) hours as needed for wheezing or shortness of  breath. 8.5 g 1    Blood Pressure KIT 1 Device by Does not apply route once a week. To be monitored weekly from home 1 kit 0    terconazole (TERAZOL 3) 0.8 % vaginal cream Place 1 applicator vaginally at bedtime. Apply nightly for three nights. 20 g 0      Review of Systems   All systems reviewed and negative except as stated in HPI  Blood pressure 130/64, pulse 89, temperature 98.3 F (36.8 C), temperature source Oral, resp. rate 16, height '5\' 3"'  (1.6 m), weight 125.1 kg, last menstrual period 05/19/2019. General appearance: alert Lungs: clear to auscultation bilaterally Heart: regular rate and rhythm Abdomen: soft, non-tender; bowel sounds normal Pelvic: 1.5/60/-3 Extremities: Homans sign is negative, no sign of DVT Presentation: cephalic Fetal monitoring: baseline 140, mod variability, pos accels, neg decels  Uterine activity: none noted  Dilation: 1 Effacement (%): 60, 70 Station: -3 Exam by:: Ignacia Felling, RNC   Prenatal labs: ABO, Rh: --/--/B POS (08/17 0739) Antibody: NEG (08/17 0739) Rubella: 1.96 (01/25 1539) RPR: Non Reactive (06/01 0843)  HBsAg: Negative (01/25 1539)  HIV: Non Reactive (06/01 0847)  GBS: Negative/-- (07/28 1347)  2 hr Glucola normal Genetic screening  Low risk Anatomy US normal   Prenatal Transfer Tool  Maternal Diabetes: No Genetic Screening: Normal Maternal Ultrasounds/Referrals: Normal Fetal Ultrasounds or other Referrals:  None Maternal Substance Abuse:  No Significant Maternal Medications: levothyroxine Significant Maternal Lab Results: Group B Strep negative  Results for orders placed or performed during the hospital encounter of 03/04/20 (from the past 24 hour(s))  CBC   Collection Time: 03/04/20  7:39 AM  Result Value Ref Range   WBC 13.5 (H) 4.0 - 10.5 K/uL   RBC 4.47 3.87 - 5.11 MIL/uL   Hemoglobin 11.6 (L) 12.0 - 15.0 g/dL   HCT 36.8 36 - 46 %   MCV 82.3 80.0 - 100.0 fL   MCH 26.0 26.0 - 34.0 pg   MCHC 31.5 30.0 - 36.0  g/dL   RDW 14.3 11.5 - 15.5 %   Platelets 489 (H) 150 - 400 K/uL   nRBC 0.0 0.0 - 0.2 %  Comprehensive metabolic panel   Collection Time: 03/04/20  7:39 AM  Result Value Ref Range   Sodium  135 135 - 145 mmol/L   Potassium 3.6 3.5 - 5.1 mmol/L   Chloride 106 98 - 111 mmol/L   CO2 20 (L) 22 - 32 mmol/L   Glucose, Bld 88 70 - 99 mg/dL   BUN 6 6 - 20 mg/dL   Creatinine, Ser 0.49 0.44 - 1.00 mg/dL   Calcium 9.1 8.9 - 10.3 mg/dL   Total Protein 6.3 (L) 6.5 - 8.1 g/dL   Albumin 2.3 (L) 3.5 - 5.0 g/dL   AST 14 (L) 15 - 41 U/L   ALT 10 0 - 44 U/L   Alkaline Phosphatase 148 (H) 38 - 126 U/L   Total Bilirubin 0.4 0.3 - 1.2 mg/dL   GFR calc non Af Amer >60 >60 mL/min   GFR calc Af Amer >60 >60 mL/min   Anion gap 9 5 - 15  Type and screen   Collection Time: 03/04/20  7:39 AM  Result Value Ref Range   ABO/RH(D) B POS    Antibody Screen NEG    Sample Expiration      03/07/2020,2359 Performed at [facility] [address], [place], [place] [address]   Protein / creatinine ratio, urine   Collection Time: 03/04/20  8:21 AM  Result Value Ref Range   Creatinine, Urine 174.51 mg/dL   Total Protein, Urine 27 mg/dL   Protein Creatinine Ratio 0.15 0.00 - 0.15 mg/mg[Cre]    Patient Active Problem List   Diagnosis Date Noted   Chronic hypertension during pregnancy, antepartum 03/04/2020   Chronic hypertension 10/08/2019   Chlamydia infection affecting pregnancy in first trimester 08/15/2019   Nausea/vomiting in pregnancy 08/14/2019   Hypothyroidism affecting pregnancy 08/13/2019   Maternal morbid obesity, antepartum ([place]) 08/13/2019   Supervision of high-risk pregnancy 07/17/2019   PCOS (polycystic ovarian syndrome) 07/04/2017   Acanthosis nigricans 05/31/2017   Other allergic rhinitis 06/23/2015   Hidradenitis suppurativa 11/10/2014   Adjustment reaction 06/18/2013   Dyspepsia 08/09/2012   Goiter 08/09/2012   Hypothyroidism, acquired, autoimmune    Obesity  10/07/2011   Prediabetes 10/07/2011    Assessment/Plan:  Jenna Benson is a 19 y.o. G1P0 at 41w0dhere for IOL for cHTN.   #Induction of Labor:  -starting SVE 1.5/60/-3, will start with cytotec and recheck in 4 hrs.   #cHTN  -repeat PEC labs on admission, monitor VS   #Hypothyroidism  -cont synthroid 1052m daily   #Pain: Epidural prn #FWB: Cat I #ID:  gbs neg #MOF: breat #MOC: Undecided  #Circ:  N/a   JuJanet BerlinMD  03/04/2020, 10:15 AM

## 2020-03-04 NOTE — Progress Notes (Signed)
Labor Progress Note Jenna Benson is a 19 y.o. G1P0 at [redacted]w[redacted]d presented for IOL for cHTN  S: Patient doing well without complaints.  O:  BP (!) 143/75   Pulse 84   Temp 98.2 F (36.8 C) (Oral)   Resp 16   Ht 5\' 3"  (1.6 m)   Wt 125.1 kg   LMP 05/19/2019 (Exact Date)   BMI 48.87 kg/m  EFM: baseline 140/moderate variability/+accelerations, no decels  Toco: intermittent   CVE: Dilation: 2 Effacement (%): 70 Cervical Position: Middle Station: -2 Presentation: Vertex Exam by:: [email or web address] MD   A&P: 19 y.o. G1P0 [redacted]w[redacted]d presenting for IOL for cHTN  #Induction of Labor: S/p FB (out at 1600). Cytotec x3. Will repeat cytotec at this time given cervical exam unchanged from prior. Continue to augment as appropriate.   #cHTN: PEC labs normal. Has had 2 recent elevated blood pressures. Asymptomatic. Will continue to monitor. Not currently on any meds  #Hypothyroidism: Last tsh 01/30/20 wnl. Continue home synthroid 100 mcg daily.  #Pre-diabetes: passed 2hr GTT on 12/18/19. Last a1c 5/1 on 11/05/19. Not on any meds.  #Pain: Tolerating, epidural prn  #FWB: Cat I #GBS negative    11/07/19, MD 8:56 PM

## 2020-03-05 ENCOUNTER — Inpatient Hospital Stay (HOSPITAL_COMMUNITY): Payer: Medicaid Other | Admitting: Anesthesiology

## 2020-03-05 ENCOUNTER — Encounter (HOSPITAL_COMMUNITY): Payer: Self-pay | Admitting: Obstetrics and Gynecology

## 2020-03-05 DIAGNOSIS — O9902 Anemia complicating childbirth: Secondary | ICD-10-CM | POA: Diagnosis not present

## 2020-03-05 DIAGNOSIS — O164 Unspecified maternal hypertension, complicating childbirth: Secondary | ICD-10-CM | POA: Diagnosis not present

## 2020-03-05 DIAGNOSIS — Z3A39 39 weeks gestation of pregnancy: Secondary | ICD-10-CM | POA: Diagnosis not present

## 2020-03-05 DIAGNOSIS — D649 Anemia, unspecified: Secondary | ICD-10-CM | POA: Diagnosis not present

## 2020-03-05 LAB — CBC
HCT: 33.1 % — ABNORMAL LOW (ref 36.0–46.0)
Hemoglobin: 10.5 g/dL — ABNORMAL LOW (ref 12.0–15.0)
MCH: 26 pg (ref 26.0–34.0)
MCHC: 31.7 g/dL (ref 30.0–36.0)
MCV: 81.9 fL (ref 80.0–100.0)
Platelets: 423 10*3/uL — ABNORMAL HIGH (ref 150–400)
RBC: 4.04 MIL/uL (ref 3.87–5.11)
RDW: 14.4 % (ref 11.5–15.5)
WBC: 16.4 10*3/uL — ABNORMAL HIGH (ref 4.0–10.5)
nRBC: 0 % (ref 0.0–0.2)

## 2020-03-05 MED ORDER — EPHEDRINE 5 MG/ML INJ: 10.0000 mg | INTRAVENOUS | Status: DC | PRN

## 2020-03-05 MED ORDER — PHENYLEPHRINE 40 MCG/ML (10ML) SYRINGE FOR IV PUSH (FOR BLOOD PRESSURE SUPPORT)
80.0000 ug | PREFILLED_SYRINGE | INTRAVENOUS | Status: DC | PRN
  Filled 2020-03-05: qty 10

## 2020-03-05 MED ORDER — DIPHENHYDRAMINE HCL 50 MG/ML IJ SOLN: 12.5000 mg | INTRAMUSCULAR | Status: DC | PRN

## 2020-03-05 MED ORDER — OXYTOCIN-SODIUM CHLORIDE 30-0.9 UT/500ML-% IV SOLN
1.0000 m[IU]/min | INTRAVENOUS | Status: DC
  Administered 2020-03-05: 2 m[IU]/min via INTRAVENOUS

## 2020-03-05 MED ORDER — FENTANYL-BUPIVACAINE-NACL 0.5-0.125-0.9 MG/250ML-% EP SOLN
12.0000 mL/h | EPIDURAL | Status: DC | PRN
  Filled 2020-03-05: qty 250

## 2020-03-05 MED ORDER — LACTATED RINGERS IV SOLN: 500.0000 mL | Freq: Once | INTRAVENOUS | Status: DC

## 2020-03-05 MED ORDER — TERBUTALINE SULFATE 1 MG/ML IJ SOLN: 0.2500 mg | Freq: Once | INTRAMUSCULAR | Status: DC | PRN

## 2020-03-05 MED ORDER — PHENYLEPHRINE 40 MCG/ML (10ML) SYRINGE FOR IV PUSH (FOR BLOOD PRESSURE SUPPORT): 80.0000 ug | PREFILLED_SYRINGE | INTRAVENOUS | Status: DC | PRN

## 2020-03-05 MED ORDER — NIFEDIPINE ER OSMOTIC RELEASE 30 MG PO TB24
30.0000 mg | ORAL_TABLET | Freq: Every day | ORAL | Status: DC
  Administered 2020-03-05: 30 mg via ORAL
  Filled 2020-03-05: qty 1

## 2020-03-05 MED ORDER — SODIUM CHLORIDE (PF) 0.9 % IJ SOLN
INTRAMUSCULAR | Status: DC | PRN
  Administered 2020-03-05: 12 mL/h via EPIDURAL

## 2020-03-05 MED ORDER — LIDOCAINE HCL (PF) 1 % IJ SOLN
INTRAMUSCULAR | Status: DC | PRN
  Administered 2020-03-05 (×2): 4 mL via EPIDURAL

## 2020-03-05 NOTE — Progress Notes (Signed)
Labor Progress Note Jenna Benson is a 19 y.o. G1P0 at [redacted]w[redacted]d presented for IOL for cHTN  S: Mildly increased contraction pain.  Still talking through contractions.  FOB and mom in the room now.  No new concerns.  O:  BP 140/73   Pulse 88   Temp 98 F (36.7 C) (Oral)   Resp 16   Ht 5\' 3"  (1.6 m)   Wt 125.1 kg   LMP 05/19/2019 (Exact Date)   BMI 48.87 kg/m  EFM: 145/moderate var/pos accels, no decels  CVE: Dilation: 4.5 Effacement (%): 50 Cervical Position: Middle Station: -2 Presentation: Vertex Exam by:: Dr [email or web address]   A&P: 19 y.o. G1P0 [redacted]w[redacted]d here for IOL for cHTN.  #Labor: Progressing well. Difficulty tracing contraction pattern.  AROM w/ clear fluid and IUPC placed. #Pain: epidural upon request #FWB: Cat I #GBS negative #cHTN: systolic pressures 120-140. No headaches or vision changes. Will continue to monitor.  [redacted]w[redacted]d, MD 1:24 PM

## 2020-03-05 NOTE — Anesthesia Preprocedure Evaluation (Signed)
Anesthesia Evaluation  Patient identified by MRN, date of birth, ID band Patient awake    Reviewed: Allergy & Precautions, Patient's Chart, lab work & pertinent test results  Airway Mallampati: II  TM Distance: >3 FB Neck ROM: Full    Dental no notable dental hx. (+) Teeth Intact   Pulmonary asthma ,    Pulmonary exam normal breath sounds clear to auscultation       Cardiovascular hypertension, Normal cardiovascular exam Rhythm:Regular Rate:Normal     Neuro/Psych PSYCHIATRIC DISORDERS negative neurological ROS     GI/Hepatic Neg liver ROS, GERD  ,  Endo/Other  Hypothyroidism Morbid obesity  Renal/GU negative Renal ROS  negative genitourinary   Musculoskeletal negative musculoskeletal ROS (+)   Abdominal (+) + obese,   Peds  Hematology  (+) anemia ,   Anesthesia Other Findings   Reproductive/Obstetrics (+) Pregnancy                             Anesthesia Physical Anesthesia Plan  ASA: III  Anesthesia Plan: Epidural   Post-op Pain Management:    Induction:   PONV Risk Score and Plan:   Airway Management Planned: Natural Airway  Additional Equipment:   Intra-op Plan:   Post-operative Plan:   Informed Consent: I have reviewed the patients History and Physical, chart, labs and discussed the procedure including the risks, benefits and alternatives for the proposed anesthesia with the patient or authorized representative who has indicated his/her understanding and acceptance.       Plan Discussed with: Anesthesiologist  Anesthesia Plan Comments:         Anesthesia Quick Evaluation

## 2020-03-05 NOTE — Progress Notes (Signed)
Labor Progress Note Jenna Benson is a 19 y.o. G1P0 at [redacted]w[redacted]d presented for IOL for cHTN.  S: No headaches or vision changes.  Comfortable for now.  Now new symptoms or complaints.  O:  BP (!) 141/77   Pulse 75   Temp 98 F (36.7 C) (Oral)   Resp 16   Ht 5\' 3"  (1.6 m)   Wt 125.1 kg   LMP 05/19/2019 (Exact Date)   BMI 48.87 kg/m  EFM: 130/moderate var/pos accels, no decels  CVE: Dilation: 4.5 Effacement (%): 50 Cervical Position: Middle Station: -2 Presentation: Vertex Exam by:: Dr [email or web address]   A&P: 19 y.o. G1P0 [redacted]w[redacted]d here for IOL for cHTN.  #Labor: Progressing well. Now s/p FB. Continue pit for now. 2x2.  Will recheck in 4 hours. #Pain: epidural upon request #FWB: Cat I #GBS negative #cHTN: systolic pressures 130-140s. No HA or vision changes.  HELLP labs WNL.  Will continue to monitor.  [redacted]w[redacted]d, MD 10:03 AM

## 2020-03-05 NOTE — Progress Notes (Signed)
Labor Progress Note Jenna Benson is a 19 y.o. G1P0 at [redacted]w[redacted]d presented for IOL for cHTN  S: Doing well without complaints.  O:  BP 131/64   Pulse 80   Temp 97.9 F (36.6 C) (Axillary)   Resp 18   Ht 5\' 3"  (1.6 m)   Wt 125.1 kg   LMP 05/19/2019 (Exact Date)   SpO2 100%   BMI 48.87 kg/m  EFM: 130/moderate var/pos accels, no decels Toco: q1-3 minutes  CVE: Dilation: 8 Effacement (%): 90 Cervical Position: Anterior Station: -1 Presentation: Vertex Exam by:: Dr. [email or web address]   A&P: 19 y.o. G1P0 [redacted]w[redacted]d here for IOL for cHTN.  #Labor: Progressing well. IUPC/FSE in place. Pit @16 , continue to augment as necessary. #Pain: CSE #FWB: Cat I #GBS negative #cHTN: Blood pressure well controlled. Asymptomatic. Pre E labs unremarkable. Will continue to monitor. #Hypothyroidism: Last tsh 01/30/20 wnl. Continue home synthroid 100 mcg daily. #Pre-diabetes: passed 2hr GTT on 12/18/19. Last a1c 5/1 on 11/05/19. EFW 3018g, 88%ile on 7/21. Not on any meds.  09-15-2002, MD 9:54 PM

## 2020-03-05 NOTE — Progress Notes (Signed)
Labor Progress Note [place] S Connell is a 19 y.o. G1P0 at [redacted]w[redacted]d presented for IOL for cHTN.  S: Patient doing well without complaints.  O:  BP 134/76   Pulse 86   Temp 98.4 F (36.9 C) (Oral)   Resp 17   Ht 5\' 3"  (1.6 m)   Wt 125.1 kg   LMP 05/19/2019 (Exact Date)   BMI 48.87 kg/m  EFM: baseline 140/moderate variability/+accelerations, no decels  Toco: every 4 minutes  CVE: Dilation: 2 Effacement (%): 70 Cervical Position: Middle Station: -2 Presentation: Vertex Exam by:: [email or web address] MD   A&P: 19 y.o. G1P0 [redacted]w[redacted]d presenting for IOL for cHTN  #Induction of Labor: S/p FB (out at 1600). Cytotec x4 (last 2300). FB re-placed at this check given patient is still unchanged, suspect prior FB may have been in vaginal canal. Will repeat cytotec at this time given cervical exam unchanged from prior. Continue to augment as appropriate.   #cHTN: PEC labs normal. BP moderately improved (130s/70s). Asymptomatic. Will continue to monitor. Not currently on any meds, will discuss anti-hypertensive initiation at next check.  #Hypothyroidism: Last tsh 01/30/20 wnl. Continue home synthroid 100 mcg daily.  #Pre-diabetes: passed 2hr GTT on 12/18/19. Last a1c 5/1 on 11/05/19. Not on any meds.  #Pain: Tolerating, epidural prn  #FWB: Cat I #GBS negative    11/07/19, MD 1:02 AM

## 2020-03-05 NOTE — Progress Notes (Signed)
Labor Progress Note Jenna Benson is a 19 y.o. G1P0 at [redacted]w[redacted]d presented for IOL for cHTN.  S: Patient doing well without complaints.  O:  BP 140/73   Pulse 75   Temp 97.6 F (36.4 C) (Oral)   Resp 16   Ht 5\' 3"  (1.6 m)   Wt 125.1 kg   LMP 05/19/2019 (Exact Date)   BMI 48.87 kg/m  EFM: baseline 140/moderate variability/+accelerations, no decels  Toco: every 1-4 minutes  CVE: Dilation: 4 Effacement (%): 80 Cervical Position: Middle Station: -2 Presentation: Vertex Exam by:: [email or web address] RN    A&P: 19 y.o. G1P0 [redacted]w[redacted]d presenting for IOL for cHTN  #Induction of Labor: S/p FB x2 and Cytotec x5. FB dislodged and pitocin initiated given cervical change. Continue augmentation as appropriate.  #cHTN: PEC labs normal. BP moderately improved (130s/70s). Asymptomatic. Never been on anti-hypertensive. Will initiate procardia xl 30 mg daily at this time.  #Hypothyroidism: Last tsh 01/30/20 wnl. Continue home synthroid 100 mcg daily.  #Pre-diabetes: passed 2hr GTT on 12/18/19. Last a1c 5/1 on 11/05/19. Not on any meds.  #Pain: Tolerating, epidural prn  #FWB: Cat I #GBS negative    11/07/19, MD 5:44 AM

## 2020-03-05 NOTE — Anesthesia Procedure Notes (Signed)
Epidural Patient location during procedure: OB Start time: 03/05/2020 2:21 PM End time: 03/05/2020 2:29 PM  Staffing Anesthesiologist: Mal Amabile, MD Performed: anesthesiologist   Preanesthetic Checklist Completed: patient identified, IV checked, site marked, risks and benefits discussed, surgical consent, monitors and equipment checked, pre-op evaluation and timeout performed  Epidural Patient position: sitting Prep: DuraPrep and site prepped and draped Patient monitoring: continuous pulse ox and blood pressure Approach: midline Location: L4-L5 Injection technique: LOR saline  Needle:  Needle type: Tuohy  Needle gauge: 17 G Needle length: 9 cm and 9 Needle insertion depth: 8 cm Catheter type: closed end flexible Catheter size: 19 Gauge Catheter at skin depth: 14 cm Test dose: negative and Other  Assessment Events: blood not aspirated, injection not painful, no injection resistance, no paresthesia and negative IV test  Additional Notes Patient identified. Risks and benefits discussed including failed block, incomplete  Pain control, post dural puncture headache, nerve damage, paralysis, blood pressure Changes, nausea, vomiting, reactions to medications-both toxic and allergic and post Partum back pain. All questions were answered. Patient expressed understanding and wished to proceed. Sterile technique was used throughout procedure. Epidural site was Dressed with sterile barrier dressing. No paresthesias, signs of intravascular injection Or signs of intrathecal spread were encountered.  Patient was more comfortable after the epidural was dosed. Please see RN's note for documentation of vital signs and FHR which are stable. Reason for block:procedure for pain

## 2020-03-06 ENCOUNTER — Encounter (HOSPITAL_COMMUNITY): Payer: Self-pay | Admitting: Obstetrics and Gynecology

## 2020-03-06 DIAGNOSIS — Z3A39 39 weeks gestation of pregnancy: Secondary | ICD-10-CM | POA: Diagnosis not present

## 2020-03-06 LAB — RPR: RPR Ser Ql: NONREACTIVE — AB

## 2020-03-06 MED ORDER — DIPHENHYDRAMINE HCL 25 MG PO CAPS: 25.0000 mg | ORAL_CAPSULE | Freq: Four times a day (QID) | ORAL | Status: DC | PRN

## 2020-03-06 MED ORDER — ACETAMINOPHEN 325 MG PO TABS: 650.0000 mg | ORAL_TABLET | ORAL | Status: DC | PRN

## 2020-03-06 MED ORDER — IBUPROFEN 600 MG PO TABS
600.0000 mg | ORAL_TABLET | Freq: Four times a day (QID) | ORAL | Status: DC
  Administered 2020-03-06 – 2020-03-07 (×5): 600 mg via ORAL
  Filled 2020-03-06 (×6): qty 1

## 2020-03-06 MED ORDER — DIBUCAINE (PERIANAL) 1 % EX OINT: 1.0000 "application " | TOPICAL_OINTMENT | CUTANEOUS | Status: DC | PRN

## 2020-03-06 MED ORDER — TETANUS-DIPHTH-ACELL PERTUSSIS 5-2.5-18.5 LF-MCG/0.5 IM SUSP: 0.5000 mL | Freq: Once | INTRAMUSCULAR | Status: DC

## 2020-03-06 MED ORDER — BENZOCAINE-MENTHOL 20-0.5 % EX AERO
1.0000 "application " | INHALATION_SPRAY | CUTANEOUS | Status: DC | PRN
  Filled 2020-03-06: qty 56

## 2020-03-06 MED ORDER — MAGNESIUM HYDROXIDE 400 MG/5ML PO SUSP: 30.0000 mL | ORAL | Status: DC | PRN

## 2020-03-06 MED ORDER — SENNOSIDES-DOCUSATE SODIUM 8.6-50 MG PO TABS
2.0000 | ORAL_TABLET | ORAL | Status: DC
  Administered 2020-03-07: 2 via ORAL
  Filled 2020-03-06 (×2): qty 2

## 2020-03-06 MED ORDER — ONDANSETRON HCL 4 MG PO TABS: 4.0000 mg | ORAL_TABLET | ORAL | Status: DC | PRN

## 2020-03-06 MED ORDER — MEDROXYPROGESTERONE ACETATE 150 MG/ML IM SUSP: 150.0000 mg | INTRAMUSCULAR | Status: DC | PRN

## 2020-03-06 MED ORDER — COCONUT OIL OIL: 1.0000 "application " | TOPICAL_OIL | Status: DC | PRN

## 2020-03-06 MED ORDER — ONDANSETRON HCL 4 MG/2ML IJ SOLN: 4.0000 mg | INTRAMUSCULAR | Status: DC | PRN

## 2020-03-06 MED ORDER — SIMETHICONE 80 MG PO CHEW: 80.0000 mg | CHEWABLE_TABLET | ORAL | Status: DC | PRN

## 2020-03-06 MED ORDER — WITCH HAZEL-GLYCERIN EX PADS: 1.0000 "application " | MEDICATED_PAD | CUTANEOUS | Status: DC | PRN

## 2020-03-06 MED ORDER — PRENATAL MULTIVITAMIN CH
1.0000 | ORAL_TABLET | Freq: Every day | ORAL | Status: DC
  Administered 2020-03-06 – 2020-03-07 (×2): 1 via ORAL
  Filled 2020-03-06 (×2): qty 1

## 2020-03-06 NOTE — Lactation Note (Signed)
This note was copied from a baby's chart. Lactation Consultation Note  Patient Name: Jenna Benson [id number]'V Date: 03/06/2020 Reason for consult: Initial assessment  Initial visit at 13 hours of life. Mom is 19 yo and a P1. Mom reports + breast changes w/pregnancy. Mom had expressed 5 mL of colostrum previously, but infant's mouth touched it and Mom then left it out at room temp for too long, so it had to be discarded.  Hand expression was taught to Mom with good results. Nurse tech came in & offered to bathe baby.   I will return. I provided size 21 flanges for pumping.   Lurline Hare [facility] 03/06/2020, 2:39 PM

## 2020-03-06 NOTE — Anesthesia Postprocedure Evaluation (Signed)
Anesthesia Post Note  Patient: Jenna Benson  Procedure(s) Performed: AN AD HOC LABOR EPIDURAL     Patient location during evaluation: Mother Baby Anesthesia Type: Epidural Level of consciousness: awake and alert Pain management: pain level controlled Vital Signs Assessment: post-procedure vital signs reviewed and stable Respiratory status: spontaneous breathing, nonlabored ventilation and respiratory function stable Cardiovascular status: stable Postop Assessment: no headache, no backache and epidural receding Anesthetic complications: no   No complications documented.  Last Vitals:  Vitals:   03/06/20 0400 03/06/20 0735  BP: 136/73 105/60  Pulse: 95 100  Resp: 18 20  Temp: 36.7 C 37.2 C  SpO2: 100% 99%    Last Pain:  Vitals:   03/06/20 0558  TempSrc:   PainSc: 0-No pain   Pain Goal:                   Authur Cubit

## 2020-03-06 NOTE — Progress Notes (Signed)
[name] was referred for history of depression/anxiety. * Referral screened out by Clinical Social Worker because none of the following criteria appear to apply: ~ History of anxiety/depression during this pregnancy, or of post-partum depression following prior delivery. ~ Diagnosis of anxiety and/or depression within last 3 years. Per further chart review, [name] diagnosed with adjustment disorder in 2014.  OR * [name]'s symptoms currently being treated with medication and/or therapy.   Please contact the Clinical Social Worker if needs arise, by [facility] request, or if [name] scores greater than 9/yes to question 10 on Edinburgh Postpartum Depression Screen.   Jenna Benson, MSW, LCSW [facility] [place] [phone]

## 2020-03-06 NOTE — Discharge Instructions (Signed)

## 2020-03-06 NOTE — Discharge Summary (Addendum)
Postpartum Discharge Summary  Patient Name: Jenna Benson DOB: June 28, 2001 MRN: [id number]  Date of admission: 03/04/2020 Delivery date:03/06/2020  Delivering provider: Arrie Senate  Date of discharge: 03/07/2020  Admitting diagnosis: Chronic hypertension during pregnancy, antepartum [O10.919] Intrauterine pregnancy: [redacted]w[redacted]d    Secondary diagnosis:  Active Problems:   Obesity   Prediabetes   Hypothyroidism, acquired, autoimmune   PCOS (polycystic ovarian syndrome)   Supervision of high-risk pregnancy   Hypothyroidism affecting pregnancy   Chlamydia infection affecting pregnancy in first trimester   Chronic hypertension   Chronic hypertension during pregnancy, antepartum   Vaginal delivery  Additional problems: none    Discharge diagnosis: Term Pregnancy Delivered, CHTN, GDM A1 and Type 2 DM                                              Post partum procedures:N/A Augmentation: AROM, Pitocin, Cytotec and IP Foley Complications: None  Hospital course: Induction of Labor With Vaginal Delivery   19y.o. yo G1P0 at 387w2das admitted to the hospital 03/04/2020 for induction of labor.  Indication for induction: cHTN.  Patient was admitted and augmented with cytotec, FBx2, and pitocin. She eventually progressed to complete and had an uncomplicated SVD. Patient had an uncomplicated labor course as follows: Membrane Rupture Time/Date: 1:06 PM ,03/05/2020   Delivery Method:Vaginal, Spontaneous  Episiotomy: None  Lacerations:  1st degree;Perineal;Labial  Details of delivery can be found in separate delivery note.  Given mildly elevated blood pressures in that continued s/p delivery in setting of known cHTN, pt was discharged on Norvasc 16m53maily with plan for 1 week BP check in clinic. Patient is discharged home 03/07/20.  Newborn Data: Birth date:03/06/2020  Birth time:12:52 AM  Gender:Female  Living status:Living  Apgars:9 ,9  Weight:3765 g   Magnesium Sulfate received:  No BMZ received: No Rhophylac:N/A MMR:N/A T-DaP:Ordered to be given prior to d/c Flu: N/A Transfusion:No  Physical exam  Vitals:   03/06/20 1247 03/06/20 1804 03/06/20 2112 03/07/20 0536  BP: 111/71 125/72 123/63 131/74  Pulse: 100 93 85 80  Resp: '20 19 20 20  ' Temp: 98.9 F (37.2 C) 97.9 F (36.6 C) 98.1 F (36.7 C) 97.6 F (36.4 C)  TempSrc:  Oral Oral Oral  SpO2: 99%  100% 100%  Weight:      Height:       General: alert, cooperative and no distress Lochia: appropriate Uterine Fundus: firm Incision: N/A DVT Evaluation: No evidence of DVT seen on physical exam. Negative Homan's sign. No significant calf/ankle edema. Labs: Lab Results  Component Value Date   WBC 16.4 (H) 03/05/2020   HGB 10.5 (L) 03/05/2020   HCT 33.1 (L) 03/05/2020   MCV 81.9 03/05/2020   PLT 423 (H) 03/05/2020   CMP Latest Ref Rng & Units 03/04/2020  Glucose 70 - 99 mg/dL 88  BUN 6 - 20 mg/dL 6  Creatinine 0.44 - 1.00 mg/dL 0.49  Sodium 135 - 145 mmol/L 135  Potassium 3.5 - 5.1 mmol/L 3.6  Chloride 98 - 111 mmol/L 106  CO2 22 - 32 mmol/L 20(L)  Calcium 8.9 - 10.3 mg/dL 9.1  Total Protein 6.5 - 8.1 g/dL 6.3(L)  Total Bilirubin 0.3 - 1.2 mg/dL 0.4  Alkaline Phos 38 - 126 U/L 148(H)  AST 15 - 41 U/L 14(L)  ALT 0 - 44 U/L 10   Edinburgh Score:  Edinburgh Postnatal Depression Scale Screening Tool 03/07/2020  I have been able to laugh and see the funny side of things. 0  I have looked forward with enjoyment to things. 0  I have blamed myself unnecessarily when things went wrong. 1  I have been anxious or worried for no good reason. 2  I have felt scared or panicky for no good reason. 1  Things have been getting on top of me. 1  I have been so unhappy that I have had difficulty sleeping. 0  I have felt sad or miserable. 1  I have been so unhappy that I have been crying. 0  The thought of harming myself has occurred to me. 1  Edinburgh Postnatal Depression Scale Total 7     After visit  meds:  Allergies as of 03/07/2020   No Known Allergies     Medication List    STOP taking these medications   aspirin EC 81 MG tablet   terconazole 0.8 % vaginal cream Commonly known as: TERAZOL 3     TAKE these medications   acetaminophen 325 MG tablet Commonly known as: Tylenol Take 2 tablets (650 mg total) by mouth every 4 (four) hours as needed (for pain scale < 4).   albuterol 108 (90 Base) MCG/ACT inhaler Commonly known as: ProAir HFA Inhale 2 puffs into the lungs every 6 (six) hours as needed for wheezing or shortness of breath.   amLODipine 5 MG tablet Commonly known as: NORVASC Take 1 tablet (5 mg total) by mouth daily.   Blood Pressure Kit 1 Device by Does not apply route once a week. To be monitored weekly from home   coconut oil Oil Apply 1 application topically as needed (for nipple pain).   ibuprofen 600 MG tablet Commonly known as: ADVIL Take 1 tablet (600 mg total) by mouth every 6 (six) hours.   levothyroxine 100 MCG tablet Commonly known as: SYNTHROID TAKE 1 TABLET(100 MCG) BY MOUTH DAILY BEFORE BREAKFAST What changed: See the new instructions.   norethindrone 0.35 MG tablet Commonly known as: Ortho Micronor Take 1 tablet (0.35 mg total) by mouth daily.   Prenatal Adult Gummy/DHA/FA 0.4-25 MG Chew Chew 1 tablet by mouth daily.       Discharge home in stable condition Infant Feeding: No evidence of DVT seen on physical exam. Negative Homan's sign. No significant calf/ankle edema. Infant Disposition:home with mother Discharge instruction: per After Visit Summary and Postpartum booklet. Activity: Advance as tolerated. Pelvic rest for 6 weeks.  Diet: carb modified diet Future Appointments: Future Appointments  Date Time Provider [place]  03/13/2020  8:15 AM [facility] NURSE [facility]  04/01/2020  2:30 PM Anyanwu, Sallyanne Havers, MD [facility]   Follow up Visit:  Please schedule this patient for a In person  postpartum visit in 4 weeks with the following provider: Any provider. Additional Postpartum F/U:2 hour GTT and BP check 1 week  High risk pregnancy complicated by: HTN and likely type 2 DM Delivery mode:  Vaginal, Spontaneous  Anticipated Birth Control: POPs > OCPs  03/07/2020 Randa Ngo, MD

## 2020-03-06 NOTE — Progress Notes (Signed)
Labor Progress Note Jenna Benson is a 19 y.o. G1P0 at [redacted]w[redacted]d who presented for IOL for cHTN.  S: Doing well, no complaints. Pain well-controlled with epidural.  O:  BP (!) 118/52   Pulse 84   Temp 97.9 F (36.6 C) (Axillary)   Resp 16   Ht 5\' 3"  (1.6 m)   Wt 125.1 kg   LMP 05/19/2019 (Exact Date)   SpO2 100%   BMI 48.87 kg/m  EFM: 140/mod variability/+ accels/no decels Toco: q41m ctx  CVE: Dilation: 8 Effacement (%): 90 Cervical Position: Anterior Station: 0 (Capit +1) Presentation: Vertex Exam by:: [email or web address] RN   A&P: 19 y.o. G1P0 [redacted]w[redacted]d presenting for IOL for cHTN. #Labor: Progressing well. IUPC/FSE in place. Pit @16  with adequate contractions, continue to augment as necessary. #Pain: Epidural #FWB: Cat I #GBS negative #cHTN: Single mild range pressure 145/73 at 2231, blood pressure otherwise well controlled. Asymptomatic. Pre E labs unremarkable. Will continue to monitor. #Hypothyroidism: Last TSH 01/30/20 wnl. Continue home synthroid 100 mcg daily. #Pre-diabetes: Passed 2hr GTT on 12/18/19. Last a1c 5.1 on 11/05/19. EFW 3018g, 88%ile on 7/21. Not on any meds.   09-15-2002, MD 12:26 AM

## 2020-03-06 NOTE — Lactation Note (Signed)
This note was copied from a baby's chart. Lactation Consultation Note  Patient Name: Jenna Benson [id number] Date: 03/06/2020 Reason for consult: Initial assessment  I returned to room after infant's bath. Infant was cueing to eat. Mom's nipples are flat at rest, but can evert with stimulation by Mom. With Mom in a side-lying position, infant was able to latch using the teacup hold. Some swallows were noted.   With Mom interpreting, I taught Dad how to assist with the teacup hold. I told Mom there is a chance a nipple shield may be needed, but it is also possible that infant can feed at the bare breast using the teacup hold and different positioning.   Dad is very affectionate & very willing to help.   Mom understands that if infant receives formula, she should pump & I showed her which flanges to try (size 21).    Lactation brochure is at the bedside, but I did not go over it with Mom, as she and Dad were bonding with baby & seeing if she would relatch.   Lurline Hare [facility] 03/06/2020, 3:31 PM

## 2020-03-07 MED ORDER — COCONUT OIL OIL: 1.0000 "application " | TOPICAL_OIL | 0 refills | Status: DC | PRN

## 2020-03-07 MED ORDER — NORETHINDRONE 0.35 MG PO TABS: 1.0000 | ORAL_TABLET | Freq: Every day | ORAL | 6 refills | Status: DC

## 2020-03-07 MED ORDER — ACETAMINOPHEN 325 MG PO TABS
650.0000 mg | ORAL_TABLET | ORAL | 0 refills | Status: DC | PRN
Start: 2020-03-07 — End: 2022-09-20

## 2020-03-07 MED ORDER — AMLODIPINE BESYLATE 5 MG PO TABS: 5.0000 mg | ORAL_TABLET | Freq: Every day | ORAL | 11 refills | Status: DC

## 2020-03-07 MED ORDER — IBUPROFEN 600 MG PO TABS
600.0000 mg | ORAL_TABLET | Freq: Four times a day (QID) | ORAL | 0 refills | Status: DC
Start: 2020-03-07 — End: 2022-09-20

## 2020-03-07 MED FILL — AMLODIPINE BESYLATE 5 MG TA: 5 | 30 days supply | Qty: 30 | Fill #0

## 2020-03-07 MED FILL — NORETHINDRONE 0.35 MG TAB: 0.35 | 56 days supply | Qty: 56 | Fill #0

## 2020-03-07 MED FILL — IBUPROFEN 600 MG TABLET: 600 | 7 days supply | Qty: 30 | Fill #0

## 2020-03-07 NOTE — Progress Notes (Signed)
CSW received consult due to score 7 on Edinburgh Depression Screen with scoring 1 on question 10.   CSW congratulated [name] on the birth of infant. CSw advised [name] of CSW's role and the reason for CSW coming to see her. [name] reports that she meant to place a 0 on question 10 but circled 1. [name] denies having any SI or HI feelings and reported that she has been feeling good since she have birth. [name] reported that she has all needed items to care for infant and reported that infant would be seen at [facility] for further care. [name] reported no other needs to this CSW.   CSW provided education regarding Baby Blues vs PMADs and provided [name] with resources for mental health follow up.  CSW encouraged [name] to evaluate her mental health throughout the postpartum period with the use of the New Mom Checklist developed by Postpartum Progress as well as the Edinburgh Postnatal Depression Scale and notify a medical professional if symptoms arise.     Jenna Benson S. Jenna Benson, MSW, LCSW [facility] [place] [phone]   

## 2020-03-07 NOTE — Lactation Note (Signed)
This note was copied from a baby's chart. Lactation Consultation Note  Patient Name: Jenna Benson [id number] Date: 03/07/2020 Reason for consult: Follow-up assessment   P1, Baby 37 hours old.  Mother is breastfeeding, pumping and formula feeding. Mother states she has had difficulty latching baby and would like assistance w/ latching. Reviewed hand expression and mother easily expressed good flow of colostrum. Nipples are short shafted. Assisted w/ placing baby in cross cradle hold and helped mother latch with teacup hold. Mother happy to feel strong suck.  Reviewed technique with FOB. When baby unlatched FOB was able to assist mother with re-latch. Parents happy to be able to latch baby although it is a work in progress. Encouraged mother to post pump at least 10 min after latching to stimulate her supply. Family will call if more assistance is needed.  Mother states her mother breastfed and will get support at home.        Maternal Data Has patient been taught Hand Expression?: Yes Does the patient have breastfeeding experience prior to this delivery?: No  Feeding Feeding Type: Breast Fed  LATCH Score Latch: Repeated attempts needed to sustain latch, nipple held in mouth throughout feeding, stimulation needed to elicit sucking reflex.  Audible Swallowing: A few with stimulation  Type of Nipple: Everted at rest and after stimulation  Comfort (Breast/Nipple): Soft / non-tender  Hold (Positioning): Assistance needed to correctly position infant at breast and maintain latch.  LATCH Score: 7  Interventions Interventions: Breast feeding basics reviewed;Assisted with latch;Skin to skin;Hand express;Breast compression;Adjust position;Support pillows  Lactation Tools Discussed/Used     Consult Status Consult Status: Follow-up Date: 03/08/20 Follow-up type: In-patient    Dahlia Byes [facility] 03/07/2020, 2:36 PM

## 2020-03-13 ENCOUNTER — Ambulatory Visit (INDEPENDENT_AMBULATORY_CARE_PROVIDER_SITE_OTHER): Payer: Medicaid Other | Admitting: *Deleted

## 2020-03-13 ENCOUNTER — Other Ambulatory Visit: Payer: Self-pay

## 2020-03-13 VITALS — BP 117/77 | HR 73

## 2020-03-13 DIAGNOSIS — I10 Essential (primary) hypertension: Secondary | ICD-10-CM

## 2020-03-13 NOTE — Progress Notes (Signed)
Patient seen and assessed by nursing staff.  Agree with documentation and plan.  

## 2020-03-13 NOTE — Progress Notes (Signed)
Subjective:  Jenna Benson is a 19 y.o. female here for BP check.   Hypertension ROS: taking medications as instructed, no medication side effects noted, no TIA's, no chest pain on exertion, no dyspnea on exertion and no swelling of ankles.    Objective:  BP 117/77   Pulse 73   Appearance alert, well appearing, and in no distress. General exam BP noted to be well controlled today in office.    Assessment:   Blood Pressure well controlled.   Plan:  Follow up as needed and at postpartum visit. Marland Kitchen

## 2020-03-20 ENCOUNTER — Encounter: Payer: Self-pay | Admitting: Pediatrics

## 2020-04-01 ENCOUNTER — Ambulatory Visit: Payer: Medicaid Other | Admitting: Obstetrics & Gynecology

## 2020-04-03 ENCOUNTER — Ambulatory Visit (INDEPENDENT_AMBULATORY_CARE_PROVIDER_SITE_OTHER): Payer: Medicaid Other | Admitting: Obstetrics and Gynecology

## 2020-04-03 ENCOUNTER — Encounter: Payer: Self-pay | Admitting: Obstetrics and Gynecology

## 2020-04-03 ENCOUNTER — Other Ambulatory Visit: Payer: Self-pay

## 2020-04-03 MED ORDER — SLYND 4 MG PO TABS: ORAL_TABLET | ORAL | 3 refills | Status: DC

## 2020-04-03 NOTE — Progress Notes (Signed)
    Post Partum Visit Note  Jenna Benson is a 19 y.o. G72P1001 female who presents for a postpartum visit. She is 4 weeks postpartum following a normal spontaneous vaginal delivery/1st degree (not repaired).  I have fully reviewed the prenatal and intrapartum course. The delivery was at 39.0 gestational weeks.  Anesthesia: epidural. Postpartum course has been uncomplicated. Baby is doing well. Baby is feeding by both breast and bottle - Similac Advance. Bleeding is light. Bowel function is normal. Bladder function is normal. Patient is not sexually active. Contraception method is oral progesterone-only contraceptive. Postpartum depression screening: negative.     Edinburgh Postnatal Depression Scale - 04/03/20 0936      Edinburgh Postnatal Depression Scale:  In the Past 7 Days   I have been able to laugh and see the funny side of things. 0    I have looked forward with enjoyment to things. 0    I have blamed myself unnecessarily when things went wrong. 1    I have been anxious or worried for no good reason. 1    I have felt scared or panicky for no good reason. 0    Things have been getting on top of me. 1    I have been so unhappy that I have had difficulty sleeping. 0    I have felt sad or miserable. 0    I have been so unhappy that I have been crying. 0    The thought of harming myself has occurred to me. 0    Edinburgh Postnatal Depression Scale Total 3           Review of Systems Pertinent items are noted in HPI.    Objective:  Blood pressure 124/79, pulse 98, weight 252 lb (114.3 kg), unknown if currently breastfeeding.  General: NAD  Assessment:    Normal postpartum exam.   Plan:   *PP: routine care d/w patient. Pt with pre-diabets and had a normal 2h GTT but was set up for 2h GTT today which was done. F/u results. Will start Slynd POPs in 2wks *cHTN: on no meds prior to pregnancy. Will d/c norvasc 5mg  qday and RTC in 1-2wks for bp check. I d/w her to call her  PCP for a yearly visit sometime this year to re-establish care.    , MD [facility] [place] [facility], [facility]

## 2020-04-10 ENCOUNTER — Other Ambulatory Visit: Payer: Self-pay

## 2020-04-10 ENCOUNTER — Ambulatory Visit (INDEPENDENT_AMBULATORY_CARE_PROVIDER_SITE_OTHER): Payer: Medicaid Other | Admitting: *Deleted

## 2020-04-10 DIAGNOSIS — I10 Essential (primary) hypertension: Secondary | ICD-10-CM

## 2020-04-10 LAB — GLUCOSE TOLERANCE, 2 HOURS
Glucose, 2 hour: 127 mg/dL (ref 65–139)
Glucose, GTT - Fasting: 97 mg/dL (ref 65–99)

## 2020-04-10 NOTE — Progress Notes (Signed)
Subjective:  Jenna Benson is a 19 y.o. female here for BP check after coming off her BP meds.   Hypertension ROS: no chest pain on exertion, no dyspnea on exertion and no swelling of ankles.    Objective:  BP 121/78   Pulse 85   Appearance alert, well appearing, and in no distress. General exam BP noted to be well controlled today in office.    Assessment:   Blood Pressure well controlled.   Plan:  Follow up as needed. Marland Kitchen

## 2020-04-16 NOTE — Progress Notes (Signed)
Patient was assessed and managed by nursing staff during this encounter. I have reviewed the chart and agree with the documentation and plan. I have also made any necessary editorial changes.  [place] Bing, MD 04/16/2020 4:41 PM

## 2020-11-04 ENCOUNTER — Other Ambulatory Visit: Payer: Self-pay | Admitting: Family Medicine

## 2020-11-04 DIAGNOSIS — E034 Atrophy of thyroid (acquired): Secondary | ICD-10-CM

## 2021-05-28 ENCOUNTER — Other Ambulatory Visit: Payer: Self-pay | Admitting: Obstetrics and Gynecology

## 2021-06-01 ENCOUNTER — Ambulatory Visit (INDEPENDENT_AMBULATORY_CARE_PROVIDER_SITE_OTHER): Payer: Medicaid Other | Admitting: Student

## 2021-06-01 ENCOUNTER — Other Ambulatory Visit: Payer: Self-pay

## 2021-06-01 ENCOUNTER — Encounter: Payer: Self-pay | Admitting: Student

## 2021-06-01 VITALS — BP 120/72 | HR 77 | Wt 234.0 lb

## 2021-06-01 DIAGNOSIS — R7303 Prediabetes: Secondary | ICD-10-CM | POA: Diagnosis not present

## 2021-06-01 DIAGNOSIS — E063 Autoimmune thyroiditis: Secondary | ICD-10-CM | POA: Diagnosis not present

## 2021-06-01 DIAGNOSIS — L732 Hidradenitis suppurativa: Secondary | ICD-10-CM | POA: Diagnosis not present

## 2021-06-01 DIAGNOSIS — E282 Polycystic ovarian syndrome: Secondary | ICD-10-CM | POA: Diagnosis not present

## 2021-06-01 MED ORDER — CLINDAMYCIN PHOSPHATE 1 % EX GEL: Freq: Two times a day (BID) | CUTANEOUS | 0 refills | Status: DC

## 2021-06-01 MED ORDER — DOXYCYCLINE HYCLATE 100 MG PO CAPS: 100.0000 mg | ORAL_CAPSULE | Freq: Two times a day (BID) | ORAL | 0 refills | Status: DC

## 2021-06-01 MED ORDER — METFORMIN HCL ER 500 MG PO TB24: 500.0000 mg | ORAL_TABLET | Freq: Two times a day (BID) | ORAL | 0 refills | Status: DC

## 2021-06-01 NOTE — Progress Notes (Signed)
    SUBJECTIVE:   CHIEF COMPLAINT / HPI: rash  PERTINENT  PMH / PSH: Hypothyroidism, PCOS, hidradenitis suppurativa, obesity  Rash Pt complains of itchy and painful rash under breasts, groin, axilla, and sides that has been present for over 5 years. States she was given a cream years ago but it did not help. Has tried various soaps (unsure what kinds) and lotions but nothing has helped. The rash is always present and does not change.  This appears to be hidradenitis suppurativa which is on patient's problem list, patient denies ever hearing the term before.  Hypothyroidism Last had TSH checked on 01/30/20 and it was 2.44.  Denies constipation, fatigue,dry skin, muscle aches, and cold sensitivity.  OBJECTIVE:   BP 120/72   Pulse 77   Wt 106.1 kg   SpO2 100%   BMI 41.45 kg/m    General: NAD, pleasant, able to participate in exam Cardiac: RRR, no murmurs. Respiratory: CTAB, normal effort, No wheezes, rales or rhonchi Extremities: no edema or cyanosis. Skin: Inflammatory lesions with scarring and skin tunnels on bilateral axillas Neuro: alert, no obvious focal deficits Psych: Normal affect and mood  ASSESSMENT/PLAN:   Hidradenitis suppurativa Patient had hidradenitis suppurativa on her problem list but states she has never heard of this before.  Her hidradenitis suppurativa is Doreene Adas stage II -Doxycycline 100 mg twice daily for 7 days -Topical Clindamycin 1% gel twice daily  -Metformin XR starting with 500 mg daily for 2 weeks, titrate up to 500 mg twice daily for 2 weeks at which point patient will return for follow-up visit. If she is tolerating metformin well, we will continue to titrate up with a goal of 1000 mg twice daily.  Metformin will be beneficial not only for hidradenitis suppurativa but also for PCOS   Patient states she is not currently pregnant, has no plan to become pregnant, and is currently taking birth control.  She is not breast-feeding.  Hypothyroidism Patient  is currently taking Synthroid 100 mcg daily.  Last TSH was checked on 01/30/2020 and was 2.44. -TSH today  Prediabetes Last hemoglobin A1c from 11/05/2019 was 5.1.  Three years ago, hemoglobin A1c was up to 5.7.  As patient is obese with hidradenitis suppurativa and PCOS, will check hemoglobin A1c today   Dr. Erick Alley, DO [facility]

## 2021-06-01 NOTE — Assessment & Plan Note (Signed)
Patient had hidradenitis suppurativa on her problem list but states she has never heard of this before.  Her hidradenitis suppurativa is Doreene Adas stage II -Doxycycline 100 mg twice daily for 7 days -Topical Clindamycin 1% gel twice daily  -Metformin XR starting with 500 mg daily for 2 weeks, titrate up to 500 mg twice daily for 2 weeks at which point patient will return for follow-up visit. If she is tolerating metformin well, we will continue to titrate up with a goal of 1000 mg twice daily.  Metformin will be beneficial not only for hidradenitis suppurativa but also for PCOS

## 2021-06-01 NOTE — Assessment & Plan Note (Signed)
Patient is currently taking Synthroid 100 mcg daily.  Last TSH was checked on 01/30/2020 and was 2.44. -TSH today

## 2021-06-01 NOTE — Patient Instructions (Signed)
It was great to see you! Thank you for allowing me to participate in your care!  I recommend that you always bring your medications to each appointment as this makes it easy to ensure you are on the correct medications and helps [place] not miss when refills are needed.  Our plans for today:  -You have hidradenitis suppurativa.  I have prescribed you an oral antibiotic called doxycycline for 1 week.  I have also prescribed you a topical antibiotic called clindamycin that you should use twice a day in the affected areas for this foreseeable future -You also have polycystic ovarian syndrome.  For this and for your hidradenitis suppurativa, I have prescribed you metformin. The metformin needs to be titrated up.  Please start taking the metformin as follows: First, take 500 mg (1 tablet) once a day for 2 weeks. Then, take 500 mg (1 tablet) in the morning and another 500 mg (1 tablet) in the evening for 2 weeks.  At this point, I would like to see you in the office again for a follow-up visit.  If everything is going well we will increase the metformin further.  We are checking some labs today, I will call you if they are abnormal will send you a MyChart message or a letter if they are normal.  If you do not hear about your labs in the next 2 weeks please let us know.  Take care and seek immediate care sooner if you develop any concerns.   Dr. Erick Alley, DO [facility]

## 2021-06-01 NOTE — Assessment & Plan Note (Signed)
Last hemoglobin A1c from

## 2021-06-02 ENCOUNTER — Telehealth: Payer: Self-pay | Admitting: Student

## 2021-06-02 LAB — HEMOGLOBIN A1C
Est. average glucose Bld gHb Est-mCnc: 120 mg/dL
Hgb A1c MFr Bld: 5.8 % — ABNORMAL HIGH (ref 4.8–5.6)

## 2021-06-02 LAB — TSH: TSH: 1.82 u[IU]/mL (ref 0.450–4.500)

## 2021-06-02 NOTE — Telephone Encounter (Addendum)
Spoke with pt on the phone regarding normal TSH results and elevated HgbA1c indicating pre-diabetes. Pt was started on metformin at last visit so no further changes at this time.

## 2021-06-02 NOTE — Telephone Encounter (Signed)
Attempted to call pt with lab results. Left VM for pt to call back.

## 2021-06-02 NOTE — Telephone Encounter (Signed)
Patient returns call to nurse line. Patient reports you can leave a detailed message on her VM if she does not answer again.

## 2021-06-28 ENCOUNTER — Other Ambulatory Visit: Payer: Self-pay | Admitting: Student

## 2021-07-09 ENCOUNTER — Ambulatory Visit (INDEPENDENT_AMBULATORY_CARE_PROVIDER_SITE_OTHER): Payer: Medicaid Other | Admitting: Student

## 2021-07-09 ENCOUNTER — Encounter: Payer: Self-pay | Admitting: Student

## 2021-07-09 ENCOUNTER — Other Ambulatory Visit: Payer: Self-pay

## 2021-07-09 VITALS — BP 118/72 | HR 77 | Ht 63.0 in | Wt 237.4 lb

## 2021-07-09 DIAGNOSIS — Z23 Encounter for immunization: Secondary | ICD-10-CM

## 2021-07-09 DIAGNOSIS — Z0000 Encounter for general adult medical examination without abnormal findings: Secondary | ICD-10-CM

## 2021-07-09 NOTE — Progress Notes (Signed)
° ° °  SUBJECTIVE:   CHIEF COMPLAINT / HPI:   Has been using clinda gel for about 2 weeks which has improved her HS. She notes a visual improvement and less pain. She also completed the course of doxycycline.   She is currently taking metformin 500 mg BID. States she sometimes get a stomach ache but not sure if its related to the metformin. No diarrhea.     PERTINENT  PMH / PSH: Hypertension, hypothyroidism, PCOS, hidradenitis suppurativa, obesity, prediabetes  OBJECTIVE:   BP 118/72    Pulse 77    Ht 5\' 3"  (1.6 m)    Wt 237 lb 6.4 oz (107.7 kg)    LMP 07/07/2021 (Exact Date)    SpO2 97%    BMI 42.05 kg/m    General: NAD, pleasant, able to participate in exam Cardiac: RRR, no murmurs. Respiratory: CTAB, normal effort, No wheezes, rales or rhonchi Abdomen: Bowel sounds present, nontender, nondistended, soft Skin: Inflammatory lesions with scarring and skin tunnels on right axilla, somewhat improved from last visit  Neuro: alert, no obvious focal deficits Psych: Normal affect and mood  ASSESSMENT/PLAN:   Prediabetes   PCOS -increase metformin to two tablets in the morning and 1 tablet in the evening for two weeks. If she tolerate this incrrease, we will further increase to two tablets twice a day  Hidradenitis suppurativa -Continue clindamycin gel twice daily  -Follow-up in 3 months or sooner if needed  Of note, pt gets [facility] from [place], she is on her last pack.  If she has any difficulty getting a refill, advised her to send me a MyChart message and I can begin prescribing this for her.  Dr. Sidi Saidane, DO [facility]

## 2021-07-09 NOTE — Patient Instructions (Addendum)
It was great to see you! Thank you for allowing me to participate in your care!  Our plans for today:  - increase metformin to two tablets in the morning and 1 tablet in the evening for two weeks. If you tolerate this well, increase further to two tablets twice a day. - Continue using the clindamycin gel twice daily  -Follow up apt in 3 months or sooner if needed  Take care and seek immediate care sooner if you develop any concerns.   Dr. Erick Alley, DO [facility]

## 2021-07-28 ENCOUNTER — Other Ambulatory Visit: Payer: Self-pay | Admitting: Student

## 2021-07-28 MED ORDER — SLYND 4 MG PO TABS: 4.0000 mg | ORAL_TABLET | Freq: Every day | ORAL | 12 refills | Status: DC

## 2021-07-28 NOTE — Progress Notes (Signed)
Called pt to discuss how she is doing on increased dose of metformin. Pt states she is tolerating metformin 1000 mg BID well with no GI upset.   During phone call pt requested I refill her BC, Slynd, which she had previously been getting from her OBGYN. She states she has not missed any doses. Slynd had been refilled.

## 2021-08-08 ENCOUNTER — Other Ambulatory Visit: Payer: Self-pay | Admitting: Family Medicine

## 2021-08-08 DIAGNOSIS — E034 Atrophy of thyroid (acquired): Secondary | ICD-10-CM

## 2021-10-07 ENCOUNTER — Ambulatory Visit (INDEPENDENT_AMBULATORY_CARE_PROVIDER_SITE_OTHER): Payer: Medicaid Other | Admitting: Student

## 2021-10-07 ENCOUNTER — Encounter: Payer: Self-pay | Admitting: Student

## 2021-10-07 ENCOUNTER — Other Ambulatory Visit: Payer: Self-pay

## 2021-10-07 VITALS — BP 127/75 | HR 71 | Ht 63.0 in | Wt 242.8 lb

## 2021-10-07 DIAGNOSIS — Z6841 Body Mass Index (BMI) 40.0 and over, adult: Secondary | ICD-10-CM

## 2021-10-07 DIAGNOSIS — L732 Hidradenitis suppurativa: Secondary | ICD-10-CM | POA: Diagnosis not present

## 2021-10-07 NOTE — Progress Notes (Signed)
? ? ?  SUBJECTIVE:  ? ?CHIEF COMPLAINT / HPI:  ? ?Hidradenitis suppurativa  ?Patient continues to use Clinda gel but not daily, usually only when her HS is hurting. States lesions are similar to when I last saw her but has one nodule in the groin area which is painful. Patient is taking metformin 1000 mg twice daily with no GI upset. ? ? ?Obesity ?Patient has hidradenitis suppurativa, PCOS, and prediabetes all of which are interrelated and worsened by obesity.  We briefly discussed eating habits and patient states she is not hungry in mornings but then very hungry later in the day and over eats. Admits to feeling bloated on occasion. Doesn't feel well after eating, feels the need to go walk to burn off the calories.  She would like assistance in losing weight. ? ?PERTINENT  PMH / PSH: Hypertension, hypothyroidism, PCOS, hidradenitis suppurativa, obesity, prediabetes ? ?OBJECTIVE:  ? ?Vitals:  ? 10/07/21 1501  ?BP: 127/75  ?Pulse: 71  ?SpO2: 100%  ? ? ? ?General: NAD, pleasant, able to participate in exam ?Cardiac: RRR, no murmurs. ?Respiratory: CTAB, normal effort, No wheezes, rales or rhonchi ?Skin: Inflammatory lesions with scarring on right and left axilla and groin area, similar to last visit.  One small lesion that barely drains purulent fluid when squeezed on right axilla and one  tender non fluctuant nodule on left groin area.  Chaperoned by [facility] during exam ?Neuro: alert, no obvious focal deficits ?Psych: Normal affect and mood ? ?ASSESSMENT/PLAN:  ? ?Hidradenitis suppurativa ?Patient has not been using clindamycin twice daily but only when lesions are painful.  Advised patient to use the clindamycin gel twice daily every day regularly to prevent lesions from getting worse. Advised patient that if lesions begin to actively drain or worsen in any way she should come in for recheck at which point we may need to start a course of doxycycline. Over all axillar lesions are slightly improved in appearance to  photo in media tab from November 2022. ? ?Obesity ?I referred patient to healthy weight and wellness clinic although not sure that they will accept her insurance.  I also referred patient to Dr. Jenne Campus. If pt is unable to get in with either them within a month I advised her to make an appointment with me to further discuss weight loss and healthy lifestyle modifications. Will also consider starting pt on GLP-1 agonist.  ? ? ?Dr. Precious Gilding, DO ?[place]  ? ? ? ? ?

## 2021-10-07 NOTE — Patient Instructions (Signed)
It was great to see you! Thank you for allowing me to participate in your care! ? ?I recommend that you always bring your medications to each appointment as this makes it easy to ensure you are on the correct medications and helps [place] not miss when refills are needed. ? ?Our plans for today:  ?- I have referred you to both the healthy weight and wellness clinic  ?- I have also referred you to Dr. Gerilyn Pilgrim our clinical nutritionist ?- Use the clindagel twice daily to prevent worsening of the HS ?- If you are unable to get into the healthy weight and wellness clinic or in with Dr. Gerilyn Pilgrim, follow up with me within the next month. Otherwise, we can follow up in about 4 months ? ? ?Take care and seek immediate care sooner if you develop any concerns.  ? ?Dr. Erick Alley, DO ?[facility] ? ?

## 2021-10-07 NOTE — Assessment & Plan Note (Signed)
Patient has not been using clindamycin twice daily but only when lesions are painful.  Advised patient to use the clindamycin twice daily every day regularly to prevent lesions from getting worse.  I do not think treatment with doxycycline is needed at this time but advised patient that if lesions begin to actively drain or worsen in any way she should come in for recheck at which point we may need to start a course of doxycycline. ?

## 2021-10-22 ENCOUNTER — Encounter: Payer: Self-pay | Admitting: Student

## 2021-12-22 ENCOUNTER — Encounter: Payer: Self-pay | Admitting: *Deleted

## 2022-01-18 ENCOUNTER — Other Ambulatory Visit: Payer: Self-pay | Admitting: Student

## 2022-02-15 ENCOUNTER — Encounter: Payer: Self-pay | Admitting: Student

## 2022-02-15 ENCOUNTER — Ambulatory Visit (INDEPENDENT_AMBULATORY_CARE_PROVIDER_SITE_OTHER): Payer: Medicaid Other | Admitting: Student

## 2022-02-15 ENCOUNTER — Other Ambulatory Visit (HOSPITAL_COMMUNITY)
Admission: RE | Admit: 2022-02-15 | Discharge: 2022-02-15 | Disposition: A | Discharge status: Home / Self Care | Payer: Medicaid Other | Source: Ambulatory Visit | Attending: Family Medicine | Admitting: Family Medicine

## 2022-02-15 VITALS — BP 136/93 | HR 76 | Resp 18 | Ht 63.0 in | Wt 243.5 lb

## 2022-02-15 DIAGNOSIS — L732 Hidradenitis suppurativa: Secondary | ICD-10-CM

## 2022-02-15 DIAGNOSIS — E034 Atrophy of thyroid (acquired): Secondary | ICD-10-CM

## 2022-02-15 DIAGNOSIS — Z124 Encounter for screening for malignant neoplasm of cervix: Secondary | ICD-10-CM | POA: Diagnosis not present

## 2022-02-15 DIAGNOSIS — E282 Polycystic ovarian syndrome: Secondary | ICD-10-CM | POA: Diagnosis not present

## 2022-02-15 MED ORDER — NORGESTIMATE-ETH ESTRADIOL 0.25-35 MG-MCG PO TABS: 1.0000 | ORAL_TABLET | Freq: Every day | ORAL | 11 refills | Status: DC

## 2022-02-15 MED ORDER — SPIRONOLACTONE 50 MG PO TABS: 50.0000 mg | ORAL_TABLET | Freq: Every day | ORAL | 3 refills | Status: DC

## 2022-02-15 MED ORDER — CLINDAMYCIN PHOSPHATE 1 % EX SWAB: 1.0000 | Freq: Two times a day (BID) | CUTANEOUS | 1 refills | Status: DC

## 2022-02-15 MED ORDER — LEVOTHYROXINE SODIUM 100 MCG PO TABS: ORAL_TABLET | ORAL | 2 refills | Status: DC

## 2022-02-15 NOTE — Patient Instructions (Signed)
It was great to see you! Thank you for allowing me to participate in your care!   Our plans for today:  -Please return in 1 to 2 weeks for a lab only visit to check your electrolytes -I prescribed you a medication called spironolactone and a new birth control called Sprintec.  Please stop taking Slynd. -I have increased your dosage of metformin to taking 1 tablet in the morning and 2 tablets in the evening. -Please return for follow-up visit in December or sooner if needed   Take care and seek immediate care sooner if you develop any concerns.   Dr. Erick Alley, DO [facility]

## 2022-02-15 NOTE — Progress Notes (Signed)
    SUBJECTIVE:   CHIEF COMPLAINT / HPI:   Patient is here today for Pap smear and would like to be screened for gonorrhea and chlamydia.  She would not like blood work for syphilis and HIV at this time as she is not very concerned for STDs.  Hidradenitis suppurativa  PCOS Patient states she continues to take metformin however because she does not eat breakfast every day she sometimes skips the morning dose.  She is prescribed metformin 500 mg twice daily.  She has been tolerating it well.  She does complain of a nodule related to her at bedtime in her right groin area which is tender to touch.  She denies any discharge from the nodule.   PERTINENT  PMH / PSH: Hidradenitis suppurativa, PCOS, obesity  OBJECTIVE:   Vitals:   02/15/22 1423  BP: (!) 136/93  Pulse: 76  Resp: 18  SpO2: 96%     General: NAD, pleasant, able to participate in exam Cardiac: RRR, no murmurs. Respiratory: CTAB, normal effort, No wheezes, rales or rhonchi Abdomen: Bowel sounds present, nontender, nondistended, no hepatosplenomegaly. Extremities: no edema or cyanosis. GU: Chaperoned by CMA.  Normal external female genitalia, several areas of scarring due to HS but no ulcers, warts, other lesions.  Moist pink vaginal mucosa, normal-appearing cervix with some cervical ectropion but no lesions, masses, ulcers.  Skin: warm and dry, nodule in right groin area with no erythema, warmth or fluctuance.  Nodule is tender to touch Neuro: alert, no obvious focal deficits Psych: Normal affect and mood  ASSESSMENT/PLAN:     Pap smear performed today with testing for gonorrhea, chlamydia, trichomonas per patient.  Hidradenitis suppurativa Patient has nodule due to at bedtime right groin area.  It does not appear to be an abscess or need drainage at this time.  There are multiple things we can do to help prevent further nodules from developing and would also help with her PCOS symptoms.  We will go ahead and increase  metformin to 1 tablet in the morning and 2 tablets in the evening totaling 1500 mg daily.  Patient agrees to eat at least a small breakfast in the morning so that she can take the 500 in the morning.  We will also start patient on spironolactone 50 mg nightly and switch her from a progesterone only birth control to Sprintec.  Patient states she is out of the clindamycin gel and would like to try the swabs.  Clindamycin swabs ordered to use as needed for HS flares.  She will return for a lab only visit in 1 to 2 weeks for a BMP as she has been started on the spironolactone. Patient requested follow-up in December or sooner if needed.  In December, she will be due for a TSH level.  Dr. Erick Alley, DO [facility]

## 2022-02-17 LAB — CYTOLOGY - PAP
Chlamydia: NEGATIVE
Comment: NEGATIVE
Comment: NEGATIVE
Comment: NORMAL
Neisseria Gonorrhea: NEGATIVE
Trichomonas: NEGATIVE

## 2022-02-18 ENCOUNTER — Telehealth: Payer: Self-pay

## 2022-02-18 NOTE — Telephone Encounter (Signed)
Patient LVM on nurse line reporting she missed a call.   Patient assumes the results of her pap smear are back.   Will forward to PCP.

## 2022-02-19 ENCOUNTER — Encounter: Payer: Self-pay | Admitting: Student

## 2022-03-01 ENCOUNTER — Other Ambulatory Visit: Payer: Medicaid Other

## 2022-03-01 DIAGNOSIS — E282 Polycystic ovarian syndrome: Secondary | ICD-10-CM | POA: Diagnosis not present

## 2022-03-02 LAB — BASIC METABOLIC PANEL
BUN/Creatinine Ratio: 15 (ref 9–23)
BUN: 9 mg/dL (ref 6–20)
CO2: 21 mmol/L (ref 20–29)
Calcium: 9.2 mg/dL (ref 8.7–10.2)
Chloride: 105 mmol/L (ref 96–106)
Creatinine, Ser: 0.61 mg/dL (ref 0.57–1.00)
Glucose: 100 mg/dL — ABNORMAL HIGH (ref 70–99)
Potassium: 4.5 mmol/L (ref 3.5–5.2)
Sodium: 141 mmol/L (ref 134–144)
eGFR: 130 mL/min/{1.73_m2} (ref >59)

## 2022-04-14 ENCOUNTER — Ambulatory Visit (INDEPENDENT_AMBULATORY_CARE_PROVIDER_SITE_OTHER): Payer: Medicaid Other | Admitting: Internal Medicine

## 2022-04-14 ENCOUNTER — Encounter (INDEPENDENT_AMBULATORY_CARE_PROVIDER_SITE_OTHER): Payer: Self-pay | Admitting: Internal Medicine

## 2022-04-14 VITALS — BP 122/72 | HR 79 | Temp 98.0°F | Ht 64.0 in | Wt 244.0 lb

## 2022-04-14 DIAGNOSIS — Z6841 Body Mass Index (BMI) 40.0 and over, adult: Secondary | ICD-10-CM

## 2022-04-14 DIAGNOSIS — Z0289 Encounter for other administrative examinations: Secondary | ICD-10-CM

## 2022-04-14 DIAGNOSIS — R5383 Other fatigue: Secondary | ICD-10-CM

## 2022-04-14 NOTE — Progress Notes (Unsigned)
Office: [phone]  /  Fax: [phone]  Initial Visit  Jenna Benson was seen in clinic today to evaluate for obesity. She is interested in losing weight to improve overall health and reduce the risk of weight related complications. She was referred by PCP. She feels has gained weight due to thyroid, is not following a nutritional plan and not physically active. Her peak weight 244 and has struggled with her weight since childhood. Her parents are overweight. She endorses fatigue and shortness of breath with exertion. No CP, palpitations or lower extremity edema.   She presents today to review program treatment options, initial physical assessment, and evaluation.      Past medical history includes:   Past Medical History:  Diagnosis Date   Asthma    Chlamydia infection affecting pregnancy in first trimester 08/15/2019   Diagnosed 08/13/19; treated with Azithromycin 09-18-19 TOC neg Partner not treated as of 10/08/19 --> sent in rx for expedited partner treatment, patient had not had any intercourse with him since her treatment   Hypertension    Hypothyroidism, acquired, autoimmune    Obesity    Pre-diabetes    Thyroiditis, autoimmune      Objective:   BP 122/72   Pulse 79   Temp 98 F (36.7 C)   Ht 5\' 4"  (1.626 m)   Wt 244 lb (110.7 kg)   SpO2 99%   BMI 41.88 kg/m  She was weighed on the bioimpedance scale:  Body mass index is 41.88 kg/m.  General:  Alert, oriented and cooperative. Patient is in no acute distress.  Respiratory: Normal respiratory effort, no problems with respiration noted  Extremities: Normal range of motion.    Mental Status: Normal mood and affect. Normal behavior. Normal judgment and thought content.   Assessment and Plan:  1. Class 3 severe obesity without serious comorbidity with body mass index (BMI) of 40.0 to 44.9 in adult, unspecified obesity type (HCC)  2. Other fatigue - EKG 12-Lead      Obesity Treatment Plan:  She will work  on garnering support from family and friends to begin weight loss journey. Work on eliminating or reducing the presence of highly processed, calorie dense foods in the home. Complete provided nutritional and psychosocial assessment questionnaire.    S Hanning will follow up in the next 1-2 weeks to review the above steps and continue evaluation and treatment.  Obesity Education Performed Today:  She was weighed on the bioimpedance scale and results were discussed and documented in the synopsis.  We discussed obesity as a disease and the importance of a more detailed evaluation of all the factors contributing to the disease.  We discussed the importance of long term lifestyle changes which include nutrition, exercise and behavioral modifications as well as the importance of customizing this to her specific health and social needs.  We discussed the benefits of reaching a healthier weight to alleviate the symptoms of existing conditions and reduce the risks of the biomechanical, metabolic and psychological effects of obesity.  We discussed the goals of this program is to improve her overall health and not simply achieve a specific BMI.  Frequent visits are very important to patient success. I plan to see her every 2 weeks for the first 3 months and then evaluate the visit frequency after that time. I explained obesity is a life-long chronic disease and long term treatments would be required. Medications to help her follow his eating plan may be offered as appropriate but are  not required. All medication decisions will be made together after the initial workup is done and benefits and side effects are discussed in depth.  The clinic rules were reviewed including the late policy, cancellation policy, no show and program fees.  [place] appears to be in the action stage of change and states they are ready to start intensive lifestyle modifications and behavioral  modifications.  30 minutes was spent today on this visit including the above counseling, pre-visit chart review, and post-visit documentation.  Thomes Dinning, MD

## 2022-05-09 IMAGING — US US MFM FETAL BPP W/O NON-STRESS
1 series · 12 of 25 positions shown · non-contrast
Comparison: none

[Series 1: us mfm fetal bpp w/o non-stress · 25 acquisitions, 12 frames shown]
[im 2/25]
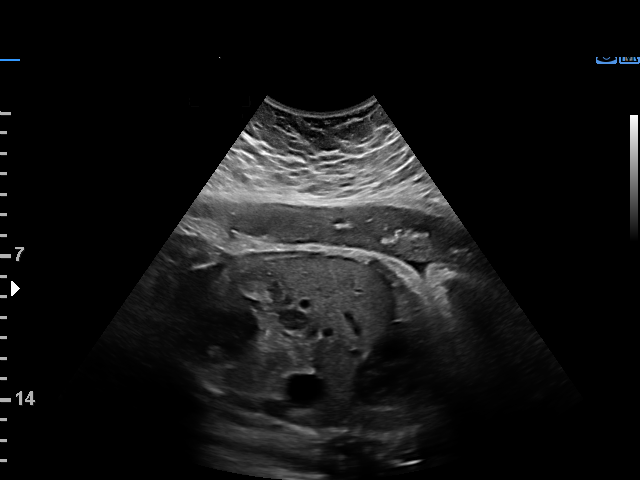
[im 4/25]
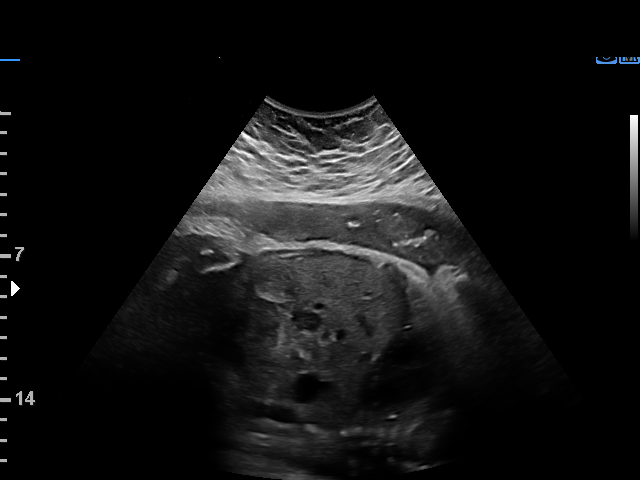
[im 6/25]
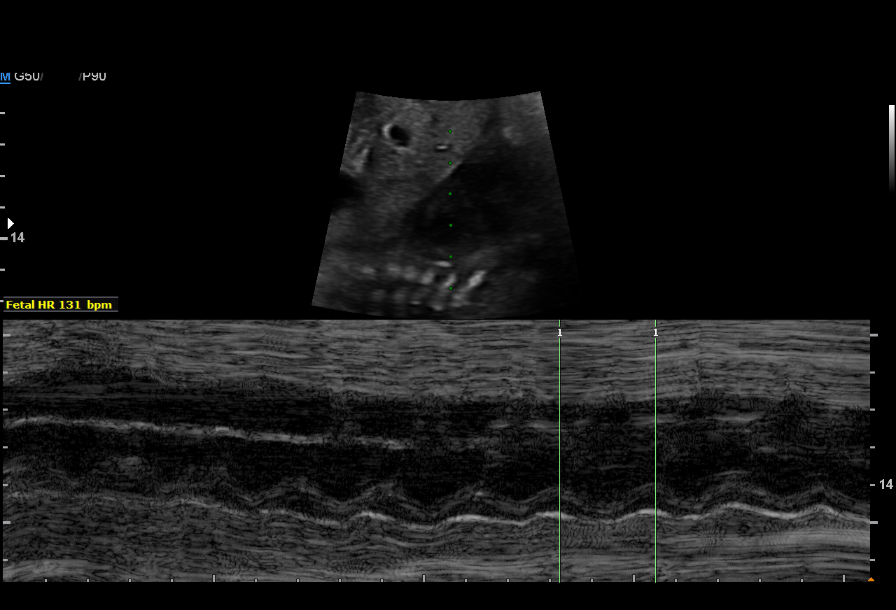
[im 8/25]
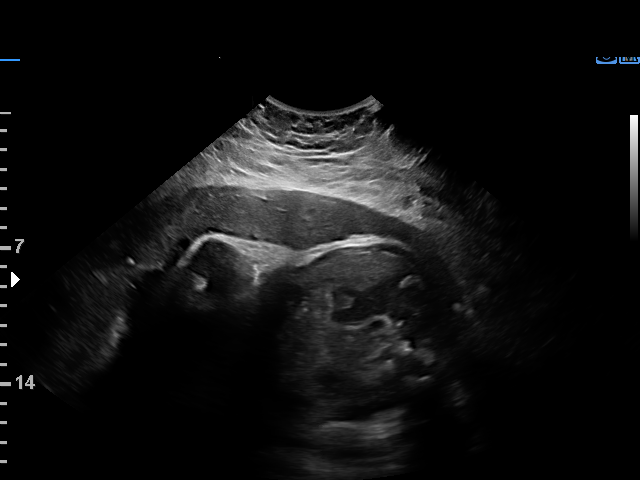
[im 10/25]
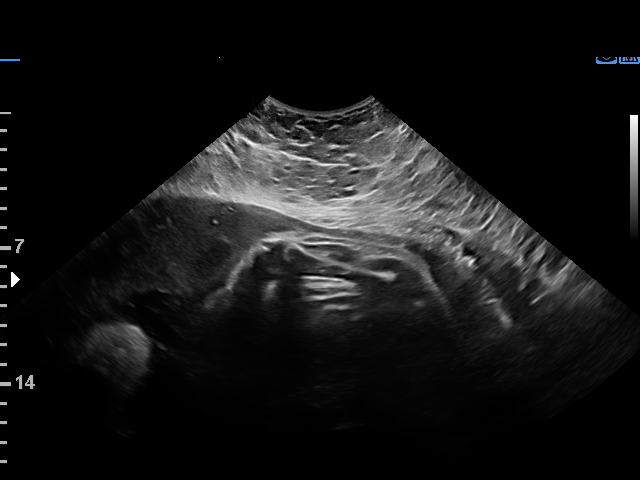
[im 12/25]
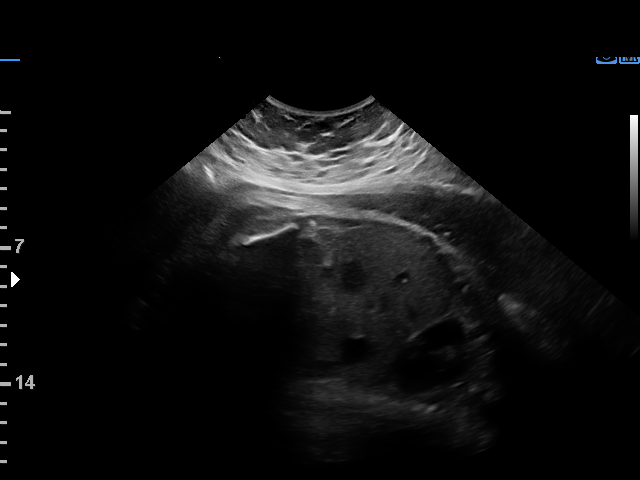
[im 14/25]
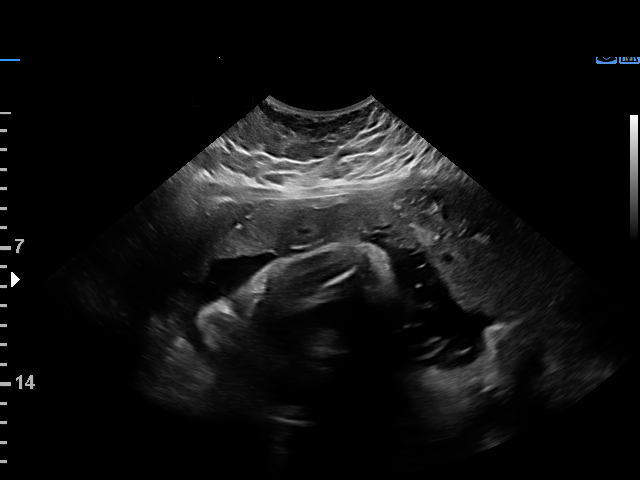
[im 16/25]
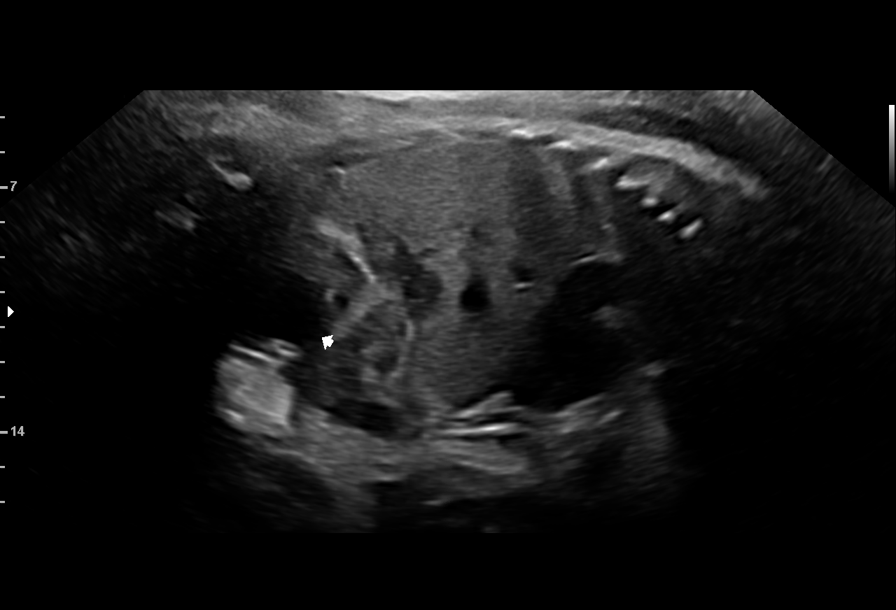
[im 18/25]
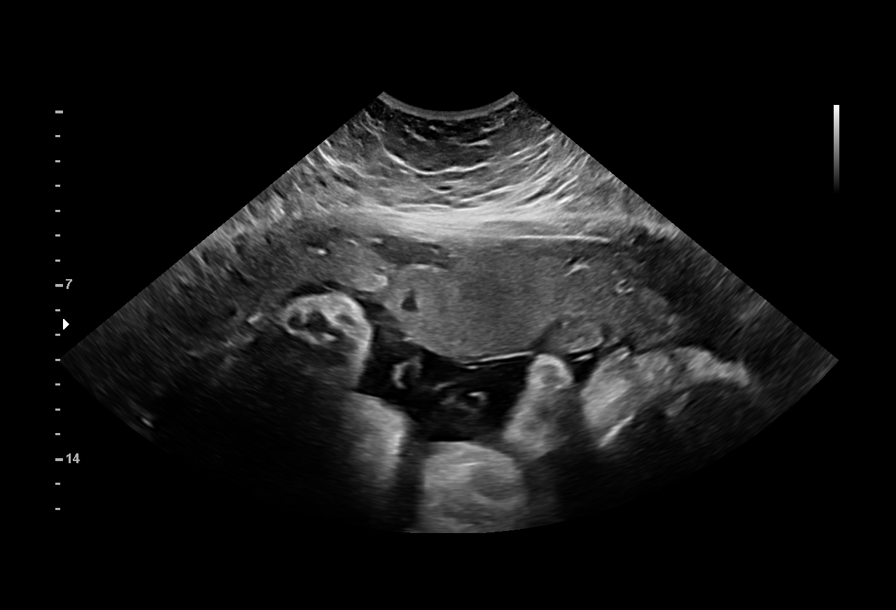
[im 20/25]
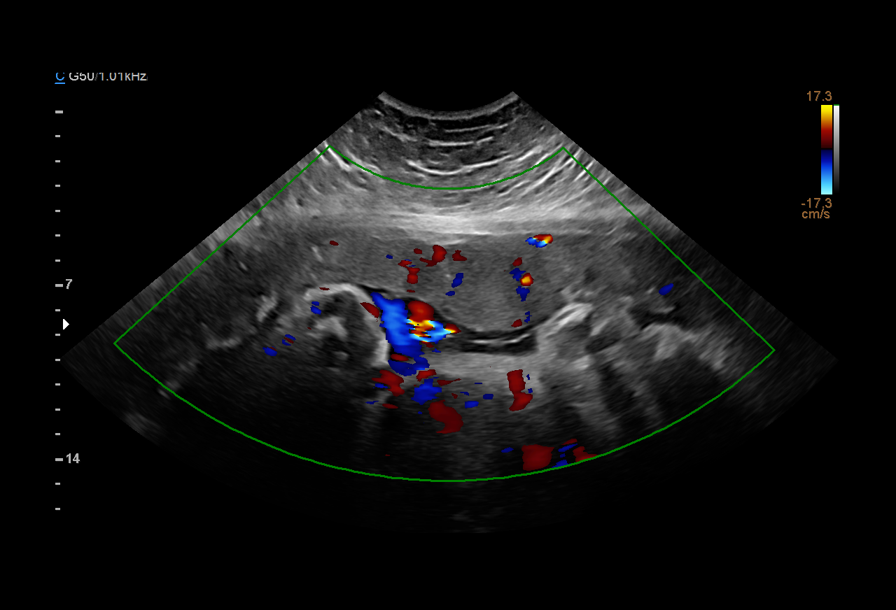
[im 22/25]
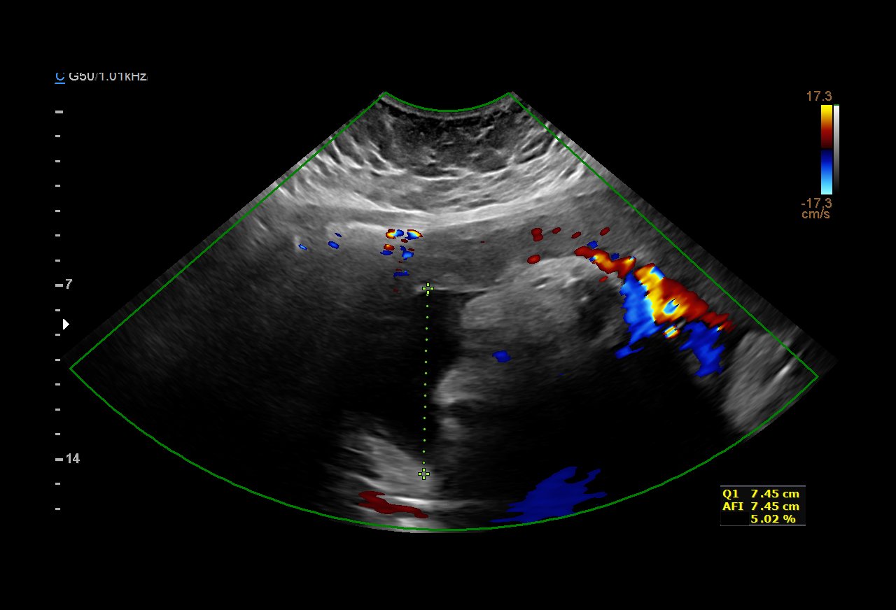
[im 24/25]
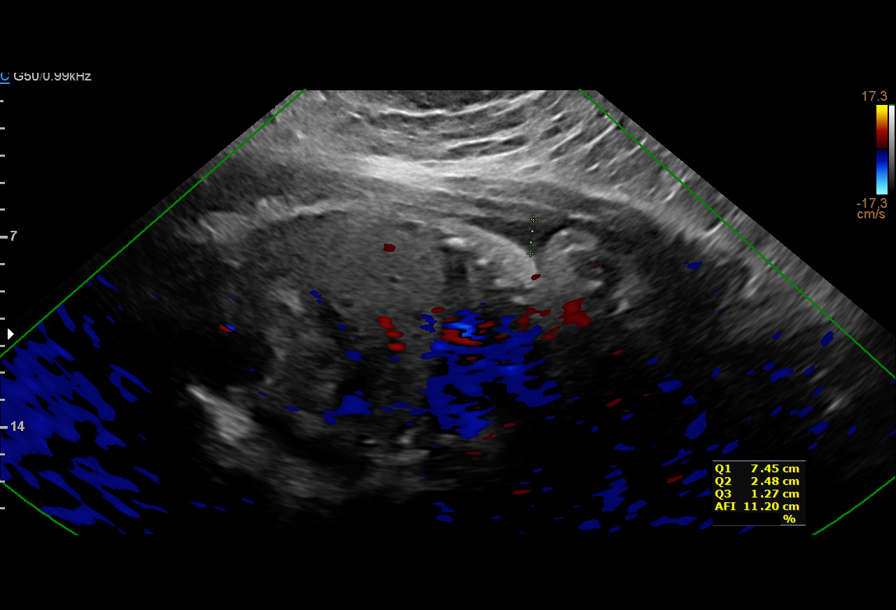

[12 of 25 positions shown; findings below may reference images not displayed]

[REDACTED]

Indications

 Maternal morbid obesity (pregravid BMI 38)
 Hypothyroid (on synthroid)
 Negative AFP(Negative 8aterni5BD)
 37 weeks gestation of pregnancy
Fetal Evaluation

 Num Of Fetuses:         1
 Fetal Heart Rate(bpm):  131
 Cardiac Activity:       Observed
 Presentation:           Cephalic
 Placenta:               Anterior
 P. Cord Insertion:      Visualized

 Amniotic Fluid
 AFI FV:      Within normal limits

 AFI Sum(cm)     %Tile       Largest Pocket(cm)
 13.49           50

 RUQ(cm)       RLQ(cm)       LUQ(cm)        LLQ(cm)

Biophysical Evaluation

 Amniotic F.V:   Within normal limits       F. Tone:        Observed
 F. Movement:    Observed                   Score:          [DATE]
 F. Breathing:   Observed
OB History

 Gravidity:    1         Term:   0        Prem:   0        SAB:   0
 TOP:          0       Ectopic:  0
Gestational Age

 LMP:           39w 5d        Date:  05/19/19                 EDD:   02/23/20
 Best:          37w 2d     Det. By:  Early Ultrasound         EDD:   03/11/20
                                     (08/01/19)
Impression

 Amniotic fluid is normal and good fetal activity is seen
 .Antenatal testing is reassuring. BPP [DATE]. Cephalic
 presentation.
Recommendations

 BPP next week.
                 TIGER

## 2022-07-26 ENCOUNTER — Other Ambulatory Visit: Payer: Self-pay | Admitting: Student

## 2022-09-17 ENCOUNTER — Ambulatory Visit: Payer: Medicaid Other | Admitting: Student

## 2022-09-20 ENCOUNTER — Encounter: Payer: Self-pay | Admitting: Student

## 2022-09-20 ENCOUNTER — Ambulatory Visit (INDEPENDENT_AMBULATORY_CARE_PROVIDER_SITE_OTHER): Payer: Medicaid Other | Admitting: Student

## 2022-09-20 VITALS — BP 114/68 | HR 91 | Ht 64.0 in | Wt 255.0 lb

## 2022-09-20 DIAGNOSIS — Z6841 Body Mass Index (BMI) 40.0 and over, adult: Secondary | ICD-10-CM

## 2022-09-20 DIAGNOSIS — E063 Autoimmune thyroiditis: Secondary | ICD-10-CM | POA: Diagnosis not present

## 2022-09-20 DIAGNOSIS — M549 Dorsalgia, unspecified: Secondary | ICD-10-CM | POA: Insufficient documentation

## 2022-09-20 DIAGNOSIS — L732 Hidradenitis suppurativa: Secondary | ICD-10-CM

## 2022-09-20 DIAGNOSIS — E282 Polycystic ovarian syndrome: Secondary | ICD-10-CM

## 2022-09-20 DIAGNOSIS — Z8639 Personal history of other endocrine, nutritional and metabolic disease: Secondary | ICD-10-CM

## 2022-09-20 LAB — POCT GLYCOSYLATED HEMOGLOBIN (HGB A1C): Hemoglobin A1C: 5.4 % (ref 4.0–5.6)

## 2022-09-20 MED ORDER — CLINDAMYCIN PHOSPHATE 1 % EX SWAB: 1.0000 | Freq: Two times a day (BID) | CUTANEOUS | 1 refills | Status: DC

## 2022-09-20 NOTE — Assessment & Plan Note (Addendum)
Patient understands that her obesity does contribute to HS symptoms and we also discussed that PCOS can make it more difficult to lose weight.  She seems motivated today to start exercising more and would like to try metformin again as she had previously tolerated it well.  We will start back at a low dose.  The metformin can help with weight loss in addition to controlling her HS and PCOS symptoms A1c is WNL today.   -She is interested in bariatric surgery although I am not sure she would qualify at this time. Will place referral to general surgery for further evaluation.   -Add back metformin 500 mg at bedtime.   -Today we set the following goals:  Dietary Goal: cut out sugary drinks: juice and soda   Exercise Goal: Complete exercise plan 3 x/week: Squats: 15 in a set x 3 Over head shoulder press: 10 in a set x 3 - can increase to 15-20 in a set if 10 is too easy Bicep curls: 10 in a set x 3 - can increase to 15-20 in a set if 10 is too easy Mountain climbers: 10 in a set x 3 - can increase to 15-20 in a set if 10 is too easy BRISK walking 3 x/ week  Return in 2 weeks for follow-up, will plan to discuss dietary modification more at next visit

## 2022-09-20 NOTE — Assessment & Plan Note (Signed)
Likely due to poor posture as it occurs when she has been slumped over for prolonged period of time.  Patient advised to work on her posture and also provided with stretches for traps and levator scapula.  Advised to hold the stretches for about 30 seconds, 3 and a set a few times a day when she is experiencing this pain.  Can take ibuprofen as needed. F/u for this prn

## 2022-09-20 NOTE — Assessment & Plan Note (Signed)
-  TSH today and continue on Synthroid 100 mcg for now

## 2022-09-20 NOTE — Addendum Note (Signed)
Addended by: Micheline Rough on: 09/20/2022 12:26 PM   Modules accepted: Orders

## 2022-09-20 NOTE — Assessment & Plan Note (Signed)
Under control with spironolactone 50 mg daily and clindamycin swabs as needed.  Patient advised that if lesions worsen/become more frequent to let me know and we can always do a course of doxycycline if needed.  Will start back on low-dose metformin.

## 2022-09-20 NOTE — Progress Notes (Addendum)
SUBJECTIVE:   CHIEF COMPLAINT / HPI:   Hypothyroidism Patient is currently taking Synthroid 100 mcg daily.  Last TSH WNL 06/01/2021.   Hidradenitis suppurativa  PCOS At last visit in July 2023, patient was prescribed Sprintec and spironolactone 50 mg to take at bedtime which she states she has been taking.  She is also still using clindamycin for lesions as needed. She stopped taking the metformin because it started making her nauseous which has resolved. Does feel that her HS is currently undercontrol using clinamycin swabs as needed.   Obesity Was referred to healthy weight and wellness, went 1 time but did not follow-up.  Today she states she would really like to loose weight and is interested in bariattric surgery.  States she has tried exersice in the past but it was difficult to stick to it. Use to go to the gym with her sister but her sister stopped going so she stopped. Then started working out at home with squats, mountain climbers and sit ups and walking, trying to exercise about 3x/week but she stopped doing this d/t low motivation.  Had tried incorporating more veggies  and whole fruits in her diet Typical Day: Drinks: water, minute maid juice 3x/day, 1-2 sodas /day   Shoulder/upperback pain Intermittent and bothers her when hunched over for a prolonged period of time such as when washing dishes.  Does not radiate and located mostly in mid thoracic back, traps. Not worsened with arm movement or neck movement.  Has not taken any medication for it.  PERTINENT  PMH / PSH: Obesity, PCOS, hidradenitis suppurativa  OBJECTIVE:   BP 114/68   Pulse 91   Ht '5\' 4"'$  (1.626 m)   Wt 255 lb (115.7 kg)   LMP 09/12/2022 (Exact Date)   SpO2 99%   BMI 43.77 kg/m    General: NAD, morbidly obese, pleasant, able to participate in exam Cardiac: Well-perfused Respiratory: Breathing comfortably on room air MSK: No pain with palpation of cervical spine, thoracic spine, or shoulders.  Good  active ROM with cervical flexion, extension and rotation without pain.  No pain reproduced with arm motion in any plane Skin: warm and dry Neuro: alert, no obvious focal deficits Psych: Normal affect and mood  ASSESSMENT/PLAN:   Obesity Patient understands that her obesity does contribute to HS symptoms and we also discussed that PCOS can make it more difficult to lose weight.  She seems motivated today to start exercising more and would like to try metformin again as she had previously tolerated it well.  We will start back at a low dose.  The metformin can help with weight loss in addition to controlling her HS and PCOS symptoms A1c is WNL today.   -She is interested in bariatric surgery although I am not sure she would qualify at this time. Will place referral to general surgery for further evaluation.   -Add back metformin 500 mg at bedtime.   -Today we set the following goals:  Dietary Goal: cut out sugary drinks: juice and soda   Exercise Goal: Complete exercise plan 3 x/week: Squats: 15 in a set x 3 Over head shoulder press: 10 in a set x 3 - can increase to 15-20 in a set if 10 is too easy Bicep curls: 10 in a set x 3 - can increase to 15-20 in a set if 10 is too easy Mountain climbers: 10 in a set x 3 - can increase to 15-20 in a set if 10 is  too easy BRISK walking 3 x/ week  Return in 2 weeks for follow-up, will plan to discuss dietary modification more at next visit   Hidradenitis suppurativa Under control with spironolactone 50 mg daily and clindamycin swabs as needed.  Patient advised that if lesions worsen/become more frequent to let me know and we can always do a course of doxycycline if needed.  Will start back on low-dose metformin.  Hypothyroidism, acquired, autoimmune -TSH today and continue on Synthroid 100 mcg for now  Upper back pain Likely due to poor posture as it occurs when she has been slumped over for prolonged period of time.  Patient advised to work  on her posture and also provided with stretches for traps and levator scapula.  Advised to hold the stretches for about 30 seconds, 3 and a set a few times a day when she is experiencing this pain.  Can take ibuprofen as needed. F/u for this prn   Health maintenance  Due for next pap smear 02/16/2023  Dr. Precious Gilding, [place]

## 2022-09-20 NOTE — Patient Instructions (Addendum)
  Start back on metformin 500 mg at bedtime.   It was great to see you! Thank you for allowing me to participate in your care!   Our plans for today:   -Add back metformin 500 mg at bedtime.   Dietary Goal: cut out sugary drinks: juice and soda   Exercise Goal: Complete exercise plan 3 x/week: Squats: 15 in a set x 3 Over head shoulder press: 10 in a set x 3 - can increase to 15-20 in a set if 10 is too easy Bicep curls: 10 in a set x 3 - can increase to 15-20 in a set if 10 is too easy Mountain climbers: 10 in a set x 3 - can increase to 15-20 in a set if 10 is too easy BRISK walking 3 x/ week  We are checking some labs today, I will call you if they are abnormal will send you a MyChart message or a letter if they are normal.  If you do not hear about your labs in the next 2 weeks please let us know.  Return in 2 weeks for a follow up visit  I will look in to other weight loss options for you in the meantime   Take care and seek immediate care sooner if you develop any concerns.   Dr. Precious Gilding, DO [facility]

## 2022-09-21 LAB — BASIC METABOLIC PANEL
BUN/Creatinine Ratio: 14 (ref 9–23)
BUN: 7 mg/dL (ref 6–20)
CO2: 22 mmol/L (ref 20–29)
Calcium: 9.3 mg/dL (ref 8.7–10.2)
Chloride: 103 mmol/L (ref 96–106)
Creatinine, Ser: 0.49 mg/dL — ABNORMAL LOW (ref 0.57–1.00)
Glucose: 96 mg/dL (ref 70–99)
Potassium: 4.5 mmol/L (ref 3.5–5.2)
Sodium: 140 mmol/L (ref 134–144)
eGFR: 137 mL/min/{1.73_m2} (ref >59)

## 2022-09-21 LAB — TSH: TSH: 3.93 u[IU]/mL (ref 0.450–4.500)

## 2022-10-06 ENCOUNTER — Ambulatory Visit: Payer: Medicaid Other | Admitting: Student

## 2022-10-06 ENCOUNTER — Encounter: Payer: Self-pay | Admitting: Student

## 2022-10-06 VITALS — BP 125/80 | HR 78 | Ht 64.72 in | Wt 253.4 lb

## 2022-10-06 DIAGNOSIS — L299 Pruritus, unspecified: Secondary | ICD-10-CM

## 2022-10-06 DIAGNOSIS — Z6841 Body Mass Index (BMI) 40.0 and over, adult: Secondary | ICD-10-CM

## 2022-10-06 NOTE — Progress Notes (Signed)
    SUBJECTIVE:   CHIEF COMPLAINT / HPI:   Weight loss Started back on Metformin after last visit, taking 1 tablet at bedtime which she is able to tolerate. She tried increasing this to 1.5 tablets but it made her nausea. She was referred to general surgery to be evaluated for bariatric surgery. She has been contacted by them with plans to attend a seminar on 11/01/21.   At last visit we set the following goals: Dietary Goal: cut out sugary drinks: juice and soda  *She was able to completely cut out soda. She does still drink juice but no more than 1 cup/day.  Today states she eats sweet mexican bread at least once a day, sometimes twice/day. Likes to have it with coffee   Exercise Goal: Complete exercise plan 3 x/week: Squats: 15 in a set x 3 Over head shoulder press: 10 in a set x 3 - can increase to 15-20 in a set if 10 is too easy Bicep curls: 10 in a set x 3 - can increase to 15-20 in a set if 10 is too easy Mountain climbers: 10 in a set x 3 - can increase to 15-20 in a set if 10 is too easy BRISK walking 3 x/ week   *she has been doing the exercises 2x/week and walking with daughter every day.     Itchy patches on skin Sometimes get itches dry skin patches for the past year, she applies she clindamycin to them but doesn't seem to help quickly and they eventually go away on their own. She does not have any of these patches right now.   PERTINENT  PMH / PSH: Obesity, PCOS, HS  OBJECTIVE:   BP 125/80   Pulse 78   Ht 5' 4.72" (1.644 m)   Wt 253 lb 6.4 oz (114.9 kg)   LMP 09/12/2022 (Exact Date)   SpO2 97%   BMI 42.53 kg/m    General: NAD, pleasant, able to participate in exam Cardiac: Well-perfused Respiratory: Breathing comfortably on room air Neuro: alert, no obvious focal deficits Psych: Normal affect and mood  ASSESSMENT/PLAN:   Obesity Patient plans to go to seminar with bariatric surgery next month.  In the meantime we have sent the following  goals:  Dietary goal: limit juice as a special treat on your day off and limit the sweet bread to no more than 2x/week   Exercise Goal:  Complete exercise plan 3 x/week: Squats: 15 in a set x 3 Over head shoulder press: 10 in a set x 3 - can increase to 15-20 in a set if 10 is too easy Bicep curls: 10 in a set x 3 - can increase to 15-20 in a set if 10 is too easy Mountain climbers: 10 in a set x 3 - can increase to 15-20 in a set if 10 is too easy BRISK walking 3 x/ week  Continue metformin 1 tablet a day  Return in 1 month for a follow up visit   Itchy skin It is possible that these intermittent patches of itchy skin could be eczema.  She does not have anything right now.  They sound mild.  Advised her to apply Vaseline or Aquaphor to the patches when they occur, she can take a picture or come see me when she has the patches for a possible diagnosis if she would like.     Dr. Precious Gilding, [place]

## 2022-10-06 NOTE — Patient Instructions (Addendum)
It was great to see you! Thank you for allowing me to participate in your care!  Our plans for today:   Dietary goal: limit juice as a special treat on your day off and limit the sweet bread to no more than 2x/week   Exercise Goal:  Complete exercise plan 3 x/week: Squats: 15 in a set x 3 Over head shoulder press: 10 in a set x 3 - can increase to 15-20 in a set if 10 is too easy Bicep curls: 10 in a set x 3 - can increase to 15-20 in a set if 10 is too easy Mountain climbers: 10 in a set x 3 - can increase to 15-20 in a set if 10 is too easy BRISK walking 3 x/ week  Continue metformin 1 tablet a day  Return in 1 month for a follow up visit  Take care and seek immediate care sooner if you develop any concerns.   Dr. Precious Gilding, DO [facility]

## 2022-10-07 DIAGNOSIS — L299 Pruritus, unspecified: Secondary | ICD-10-CM | POA: Insufficient documentation

## 2022-10-07 NOTE — Assessment & Plan Note (Signed)
Patient plans to go to seminar with bariatric surgery next month.  In the meantime we have sent the following goals:  Dietary goal: limit juice as a special treat on your day off and limit the sweet bread to no more than 2x/week   Exercise Goal:  Complete exercise plan 3 x/week: Squats: 15 in a set x 3 Over head shoulder press: 10 in a set x 3 - can increase to 15-20 in a set if 10 is too easy Bicep curls: 10 in a set x 3 - can increase to 15-20 in a set if 10 is too easy Mountain climbers: 10 in a set x 3 - can increase to 15-20 in a set if 10 is too easy BRISK walking 3 x/ week  Continue metformin 1 tablet a day  Return in 1 month for a follow up visit

## 2022-10-07 NOTE — Assessment & Plan Note (Signed)
It is possible that these intermittent patches of itchy skin could be eczema.  She does not have anything right now.  They sound mild.  Advised her to apply Vaseline or Aquaphor to the patches when they occur, she can take a picture or come see me when she has the patches for a possible diagnosis if she would like.

## 2022-11-08 ENCOUNTER — Ambulatory Visit (INDEPENDENT_AMBULATORY_CARE_PROVIDER_SITE_OTHER): Payer: Medicaid Other | Admitting: Student

## 2022-11-08 ENCOUNTER — Encounter: Payer: Self-pay | Admitting: Student

## 2022-11-08 VITALS — BP 110/68 | HR 71 | Ht 64.0 in | Wt 250.0 lb

## 2022-11-08 DIAGNOSIS — Z6841 Body Mass Index (BMI) 40.0 and over, adult: Secondary | ICD-10-CM

## 2022-11-08 NOTE — Progress Notes (Signed)
    SUBJECTIVE:   CHIEF COMPLAINT / HPI:   Weight loss Patient is continuing to tolerate metformin 1 tablet daily.  Last visit goals were as follows:  Dietary goal: limit juice as a special treat on your day off and limit the sweet bread to no more than 2x/week  **Pt states completely cut out juice and is only drinking water and hot tea with tsp honey for past two weeks d/t cough. **She has not bought the sweet bread in the past week to avoid eating it. Prior to this she was eating it on occasion.    Exercise Goal:  Complete exercise plan 3 x/week: Squats: 15 in a set x 3 Over head shoulder press: 10 in a set x 3 - can increase to 15-20 in a set if 10 is too easy Bicep curls: 10 in a set x 3 - can increase to 15-20 in a set if 10 is too easy Mountain climbers: 10 in a set x 3 - can increase to 15-20 in a set if 10 is too easy BRISK walking 3 x/ week  **She has been trying to do the exercises twice a week but ws unable to this week d/t the death of her cousin.  ** She is still walking with her daughter when it is warm outside.   Pt was referred to general surgery to explore possibility of bariatric sx.  Missed apt b/c cousin died. Doesn't want to peruse it at this time.  I offered pt to discuss recent death of her cousin, she declined stating she did not want to talk about it and wanted to focus on healthy life style discussion.   PERTINENT  PMH / PSH: Hypothyroidism, PCOS, HS, obesity   OBJECTIVE:   BP 110/68   Pulse 71   Ht 5\' 4"  (1.626 m)   Wt 250 lb (113.4 kg)   LMP 10/31/2022 (Exact Date)   SpO2 100%   Breastfeeding No   BMI 42.91 kg/m    General: NAD, pleasant, able to participate in exam Cardiac: Well perfused  Respiratory: NWOB Neuro: alert, no obvious focal deficits Psych: Normal affect and mood  ASSESSMENT/PLAN:   Obesity Pt added a new dietary goal and wanted to reduce exercise goal to 2x/week to make it more attainable. Goals as in AVS:  Dietary goal:  continue to limit juice as a special treat on your day off and limit the sweet bread to no more than 2x/week  New goal: Half of dinner plate is healthy non starchy fresh vegetable 3 times/week and a lean protein (not fatty or fried) examples: grilled/baked chicken or fish    Exercise Goal:  Complete exercise plan 2 x/week: Squats: 15 in a set x 3 Over head shoulder press: 10 in a set x 3 - can increase to 15-20 in a set if 10 is too easy Bicep curls: 10 in a set x 3 - can increase to 15-20 in a set if 10 is too easy Mountain climbers: 10 in a set x 3 - can increase to 15-20 in a set if 10 is too easy BRISK walking 3 x/ week  -Return in 1 month or sooner if needed.      Dr. Erick Alley, DO [facility]

## 2022-11-08 NOTE — Patient Instructions (Signed)
It was great to see you! Thank you for allowing me to participate in your care!  Dietary goal: continue to limit juice as a special treat on your day off and limit the sweet bread to no more than 2x/week  New goal: Half of dinner plate is healthy non starchy fresh vegetable 3 times/week and a lean protein (not fatty or fried) examples: grilled/baked chicken or fish    Exercise Goal:  Complete exercise plan 2 x/week: Squats: 15 in a set x 3 Over head shoulder press: 10 in a set x 3 - can increase to 15-20 in a set if 10 is too easy Bicep curls: 10 in a set x 3 - can increase to 15-20 in a set if 10 is too easy Mountain climbers: 10 in a set x 3 - can increase to 15-20 in a set if 10 is too easy BRISK walking 3 x/ week  -Return in 1 month or sooner if needed.   Dr. Erick Alley, DO [facility]

## 2022-11-10 NOTE — Assessment & Plan Note (Signed)
Pt added a new dietary goal and wanted to reduce exercise goal to 2x/week to make it more attainable. Goals as in AVS:  Dietary goal: continue to limit juice as a special treat on your day off and limit the sweet bread to no more than 2x/week  New goal: Half of dinner plate is healthy non starchy fresh vegetable 3 times/week and a lean protein (not fatty or fried) examples: grilled/baked chicken or fish    Exercise Goal:  Complete exercise plan 2 x/week: Squats: 15 in a set x 3 Over head shoulder press: 10 in a set x 3 - can increase to 15-20 in a set if 10 is too easy Bicep curls: 10 in a set x 3 - can increase to 15-20 in a set if 10 is too easy Mountain climbers: 10 in a set x 3 - can increase to 15-20 in a set if 10 is too easy BRISK walking 3 x/ week  -Return in 1 month or sooner if needed.

## 2022-12-20 NOTE — Progress Notes (Unsigned)
    SUBJECTIVE:   CHIEF COMPLAINT / HPI:   ***  PERTINENT  PMH / PSH: ***  OBJECTIVE:   There were no vitals taken for this visit. ***  General: NAD, pleasant, able to participate in exam Cardiac: RRR, no murmurs. Respiratory: CTAB, normal effort, No wheezes, rales or rhonchi Abdomen: Bowel sounds present, nontender, nondistended, no hepatosplenomegaly. Extremities: no edema or cyanosis. Skin: warm and dry, no rashes noted Neuro: alert, no obvious focal deficits Psych: Normal affect and mood  ASSESSMENT/PLAN:   No problem-specific Assessment & Plan notes found for this encounter.     Dr. Jaylynn Siefert, DO [facility]    {    This will disappear when note is signed, click to select method of visit    :1}  

## 2022-12-23 ENCOUNTER — Ambulatory Visit (INDEPENDENT_AMBULATORY_CARE_PROVIDER_SITE_OTHER): Payer: Medicaid Other | Admitting: Student

## 2022-12-23 ENCOUNTER — Encounter: Payer: Self-pay | Admitting: Student

## 2022-12-23 VITALS — BP 110/62 | HR 74 | Ht 64.0 in | Wt 244.0 lb

## 2022-12-23 DIAGNOSIS — Z6841 Body Mass Index (BMI) 40.0 and over, adult: Secondary | ICD-10-CM

## 2022-12-23 DIAGNOSIS — L732 Hidradenitis suppurativa: Secondary | ICD-10-CM | POA: Diagnosis not present

## 2022-12-23 MED ORDER — CLINDAMYCIN PHOSPHATE 1 % EX SWAB: 1.0000 | Freq: Two times a day (BID) | CUTANEOUS | 1 refills | Status: DC

## 2022-12-23 NOTE — Patient Instructions (Addendum)
It was great to see you! Thank you for allowing me to participate in your care!  Our plans for today:  Dietary goal: continue to limit juice as a special treat on your day off and limit the sweet bread to no more than 2x/week  New goal: Half of dinner plate is healthy non starchy fresh vegetable 4 times/week and a lean protein (not fatty or fried) examples: grilled/baked chicken or fish    Exercise Goal:  Complete exercise plan 2 x/week: Squats: 15 in a set x 3 Over head shoulder press: 10 in a set x 3 - can increase to 15-20 in a set if 10 is too easy Bicep curls: 10 in a set x 3 - can increase to 15-20 in a set if 10 is too easy Mountain climbers: 10 in a set x 3 - can increase to 15-20 in a set if 10 is too easy BRISK walking or intense playtime w/ daughter 3 x/ week for 20-30 minutes (Heart rate up and having to catch breath)    In future, can consider phentermine (weight loss medication)    Take care and seek immediate care sooner if you develop any concerns.   Dr. Erick Alley, DO [facility]

## 2022-12-23 NOTE — Assessment & Plan Note (Signed)
Goal per AVS:  Our plans for today:  Dietary goal: continue to limit juice as a special treat on your day off and limit the sweet bread to no more than 2x/week  New goal: Half of dinner plate is healthy non starchy fresh vegetable 4 times/week and a lean protein (not fatty or fried) examples: grilled/baked chicken or fish.     Exercise Goal:  Complete exercise plan 2 x/week: Squats: 15 in a set x 3 Over head shoulder press: 10 in a set x 3 - can increase to 15-20 in a set if 10 is too easy Bicep curls: 10 in a set x 3 - can increase to 15-20 in a set if 10 is too easy Mountain climbers: 10 in a set x 3 - can increase to 15-20 in a set if 10 is too easy BRISK walking or intense playtime w/ daughter 3 x/ week for 20-30 minutes (Heart rate up and having to catch breath)    In future, can consider phentermine (weight loss medication)   Pt given printed calenders for June and July to record physical activity and plans to bring back to next apt.

## 2023-01-23 ENCOUNTER — Other Ambulatory Visit: Payer: Self-pay | Admitting: Student

## 2023-01-31 NOTE — Progress Notes (Deleted)
    SUBJECTIVE:   CHIEF COMPLAINT / HPI:   Obesity Goals met at last visit include:  Dietary goal: continue to limit juice as a special treat on your day off and limit the sweet bread to no more than 2x/week  New goal: Half of dinner plate is healthy non starchy fresh vegetable 4 times/week and a lean protein (not fatty or fried) examples: grilled/baked chicken or fish.     Exercise Goal:  Complete exercise plan 2 x/week: Squats: 15 in a set x 3 Over head shoulder press: 10 in a set x 3 - can increase to 15-20 in a set if 10 is too easy Bicep curls: 10 in a set x 3 - can increase to 15-20 in a set if 10 is too easy Mountain climbers: 10 in a set x 3 - can increase to 15-20 in a set if 10 is too easy BRISK walking or intense playtime w/ daughter 3 x/ week for 20-30 minutes (Heart rate up and having to catch breath)   She was provided printed calendar for June and July to record physical activity.  PERTINENT  PMH / PSH: ***  OBJECTIVE:   There were no vitals taken for this visit. ***  General: NAD, pleasant, able to participate in exam Cardiac: RRR, no murmurs. Respiratory: CTAB, normal effort, No wheezes, rales or rhonchi Abdomen: Bowel sounds present, nontender, nondistended, no hepatosplenomegaly. Extremities: no edema or cyanosis. Skin: warm and dry, no rashes noted Neuro: alert, no obvious focal deficits Psych: Normal affect and mood  ASSESSMENT/PLAN:   No problem-specific Assessment & Plan notes found for this encounter.     Dr. Erick Alley, DO  [facility]    {    This will disappear when note is signed, click to select method of visit    :1}

## 2023-02-01 ENCOUNTER — Ambulatory Visit: Payer: Medicaid Other | Admitting: Student

## 2023-04-22 ENCOUNTER — Other Ambulatory Visit: Payer: Self-pay | Admitting: Student

## 2023-04-22 DIAGNOSIS — E034 Atrophy of thyroid (acquired): Secondary | ICD-10-CM

## 2023-05-02 NOTE — Progress Notes (Signed)
SUBJECTIVE:   CHIEF COMPLAINT / HPI:   Obesity Not currently exercising d/t work schedule - works 8-6 most week days and some weekends. 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 Feels she could try to exersice twice a week.  Trying to make healthy choices- getting salads when possible but eating fast foods about 3x week. Sometimes skips meals. She would like to start a GLP-1 today to aid in weight loss efforts.  Would also like to get back into an exercise regimen and work on healthy eating.  No family history of thyroid cancer  HS Currently using clindamycin wipes daily after showering.  Also takes spironolactone 50 mg daily.  Was previously taking metformin but stopped d/t stomach upset  New lesions have recently popped up and are not getting better with the clindamycin wipes.  States they are under bilateral axillas and left breast.  She denies feeling ill, body aches, fevers   PERTINENT  PMH / PSH: Obesity, at bedtime  OBJECTIVE:   BP 121/70   Pulse 72   Temp 98.1 F (36.7 C)   Ht 5\' 4"  (1.626 m)   Wt 240 lb 3.2 oz (109 kg)   LMP 05/03/2023   SpO2 98%   BMI 41.23 kg/m    General: NAD, pleasant, able to participate in exam Cardiac: Well-perfused Respiratory: Breathing comfortably on room air  Skin: warm and dry, erythematous circular lesion on left breast with central hyperpigmentation, it is soft, nonfluctuant slightly warm to touch.  No drainage.  Bilateral scarring of axillas with pustular lesions scattered throughout.  See photos Neuro: alert, no obvious focal deficits Psych: Normal affect and mood      ASSESSMENT/PLAN:   Soft tissue infection Lesion on breast appears to be nonpurulent cellulitis and lesions in axillas could be related to HS versus folliculitis.  Reassuringly vitals are stable, no systemic symptoms.   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  Today we made the following goals:  Exercise goal: 2x/week Squats: 15 in a set x 3 Over head shoulder press: 10 in a set x 3 - can increase to 15-20 in a set if 10 is too easy Bicep curls: 10 in a set x 3 - can increase to 15-20 in a set if 10 is too easy Mountain climbers: 10 in a set x 3 - can increase to 15-20 in a set if 10 is too easy  Dietary goal: Plan out healthy snacks/meals to carry in lunch box : whole fruits, sandwiches, greek yogurt, nuts, cheese. Etc to limit fast foods and avoid skipping meals. Goal of only 1 fast food meal per week and no missed meals.   Rx for semaglutide 0.25 mg injected weekly Patient to return in 3 weeks to f/u goals and increase to 0.5 mg weekly if tolerating well     Hidradenitis suppurativa Unsure if lesions and axillas are related to at HS versus a folliculitis.  No nodular lesions at this time. -Rx doxycycline - can extend if needed -Continue clindamycin swabs as needed -Continue spironolactone -Continue Sprintec -Will work on weight loss -Offered dermatology referral which patient  declined at this time     Dr. Erick Alley, DO [facility]

## 2023-05-03 ENCOUNTER — Encounter: Payer: Self-pay | Admitting: Student

## 2023-05-03 ENCOUNTER — Ambulatory Visit: Payer: Medicaid Other | Admitting: Student

## 2023-05-03 VITALS — BP 121/70 | HR 72 | Temp 98.1°F | Ht 64.0 in | Wt 240.2 lb

## 2023-05-03 DIAGNOSIS — L089 Local infection of the skin and subcutaneous tissue, unspecified: Secondary | ICD-10-CM | POA: Insufficient documentation

## 2023-05-03 DIAGNOSIS — E66813 Obesity, class 3: Secondary | ICD-10-CM

## 2023-05-03 DIAGNOSIS — Z6841 Body Mass Index (BMI) 40.0 and over, adult: Secondary | ICD-10-CM

## 2023-05-03 DIAGNOSIS — L732 Hidradenitis suppurativa: Secondary | ICD-10-CM

## 2023-05-03 MED ORDER — DOXYCYCLINE HYCLATE 100 MG PO CAPS: 100.0000 mg | ORAL_CAPSULE | Freq: Two times a day (BID) | ORAL | 0 refills | Status: DC

## 2023-05-03 MED ORDER — SEMAGLUTIDE-WEIGHT MANAGEMENT 0.25 MG/0.5ML ~~LOC~~ SOAJ: 0.2500 mg | SUBCUTANEOUS | 0 refills | Status: DC

## 2023-05-03 NOTE — Assessment & Plan Note (Addendum)
Unsure if lesions and axillas are related to at HS versus a folliculitis.  No nodular lesions at this time. -Rx doxycycline - can extend if needed -Continue clindamycin swabs as needed -Continue spironolactone -Continue Sprintec -Will work on weight loss -Offered dermatology referral which patient declined at this time

## 2023-05-03 NOTE — Patient Instructions (Addendum)
I sent in a prescription for an antibiotic called doxycycline.  Take twice daily with a meal for 10 days.  If lesions do not get better or worsen after a few days of treatment, let me know and we can switch to a different antibiotic.  I also sent a prescription for semaglutide, an injection to help with weight loss.  Inject once weekly.  If you tolerate this well, after 4 weeks we will increase the dosage.  Please schedule follow-up appointment for ~ 3 weeks from now.  Return sooner if needed.  If you develop any fevers, chills, please go to the ED.  Exercise goal: 2x/week Squats: 15 in a set x 3 Over head shoulder press: 10 in a set x 3 - can increase to 15-20 in a set if 10 is too easy Bicep curls: 10 in a set x 3 - can increase to 15-20 in a set if 10 is too easy Mountain climbers: 10 in a set x 3 - can increase to 15-20 in a set if 10 is too easy  Dietary goal: Plan out healthy snacks/meals to carry in lunch box : whole fruits, sandwiches, greek yogurt, nuts, cheese. Etc to limit fast foods and avoid skipping meals. Goal of only 1 fast food meal per week and no missed meals.

## 2023-05-03 NOTE — Assessment & Plan Note (Signed)
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  Today we made the following goals:  Exercise goal: 2x/week Squats: 15 in a set x 3 Over head shoulder press: 10 in a set x 3 - can increase to 15-20 in a set if 10 is too easy Bicep curls: 10 in a set x 3 - can increase to 15-20 in a set if 10 is too easy Mountain climbers: 10 in a set x 3 - can increase to 15-20 in a set if 10 is too easy  Dietary goal: Plan out healthy snacks/meals to carry in lunch box : whole fruits, sandwiches, greek yogurt, nuts, cheese. Etc to limit fast foods and avoid skipping meals. Goal of only 1 fast food meal per week and no missed meals.   Rx for semaglutide 0.25 mg injected weekly Patient to return in 3 weeks to f/u goals and increase to 0.5 mg weekly if tolerating well

## 2023-05-03 NOTE — Assessment & Plan Note (Signed)
Lesion on breast appears to be nonpurulent cellulitis and lesions in axillas could be related to HS versus folliculitis.  Reassuringly vitals are stable, no systemic symptoms.   -Rx doxycycline d/t hx HS -Return and ED precautions discussed, can add Keflex for better strep coverage if needed

## 2023-05-23 ENCOUNTER — Other Ambulatory Visit: Payer: Self-pay

## 2023-05-23 ENCOUNTER — Encounter: Payer: Self-pay | Admitting: Student

## 2023-05-23 ENCOUNTER — Ambulatory Visit (INDEPENDENT_AMBULATORY_CARE_PROVIDER_SITE_OTHER): Payer: Medicaid Other | Admitting: Student

## 2023-05-23 VITALS — BP 120/68 | HR 76 | Ht 64.0 in | Wt 243.2 lb

## 2023-05-23 DIAGNOSIS — Z6841 Body Mass Index (BMI) 40.0 and over, adult: Secondary | ICD-10-CM | POA: Diagnosis not present

## 2023-05-23 DIAGNOSIS — E282 Polycystic ovarian syndrome: Secondary | ICD-10-CM

## 2023-05-23 DIAGNOSIS — E66813 Obesity, class 3: Secondary | ICD-10-CM | POA: Diagnosis not present

## 2023-05-23 DIAGNOSIS — L732 Hidradenitis suppurativa: Secondary | ICD-10-CM | POA: Diagnosis not present

## 2023-05-23 MED ORDER — NORGESTIMATE-ETH ESTRADIOL 0.25-35 MG-MCG PO TABS: 1.0000 | ORAL_TABLET | Freq: Every day | ORAL | 11 refills | Status: DC

## 2023-05-23 MED ORDER — CLINDAMYCIN PHOSPHATE 1 % EX SWAB: 1.0000 | Freq: Two times a day (BID) | CUTANEOUS | 1 refills | Status: DC

## 2023-05-23 NOTE — Assessment & Plan Note (Addendum)
1 new lesion under left breast but does not appear to be infected, not painful or bothering patient.  -Increase clindamycin swabs to daily to prevent new lesions, refill sent to pharmacy -Return if any nodular, draining, painful lesions develop

## 2023-05-23 NOTE — Patient Instructions (Addendum)
It was great to see you! Thank you for allowing me to participate in your care!  I will look into insurance coverage for ozempic   Our plans for today:  Obesity The following goals were set at last visit: Exercise goal: 2x/week Squats: 15 in a set x 3 Over head shoulder press: 10 in a set x 3 - can increase to 15-20 in a set if 10 is too easy Bicep curls: 10 in a set x 3 - can increase to 15-20 in a set if 10 is too easy Mountain climbers: 10 in a set x 3 - can increase to 15-20 in a set if 10 is too easy   Dietary goal: Plan out healthy snacks/meals to carry in lunch box : whole fruits, sandwiches, greek yogurt, nuts, cheese. Etc to limit fast foods and avoid skipping meals. Goal of only 1 fast food meal per week and no missed meals.   Use clindamycin swabs DAILY to help prevent new lesions. Return if you have any new nodular, painful or draining lesions   Return in 1 month for f/u  Take care and seek immediate care sooner if you develop any concerns.   Dr. Erick Alley, DO [facility]

## 2023-05-23 NOTE — Assessment & Plan Note (Signed)
Will continue current dietary and exercise goals as in HPI, patient encouraged to increase exercise to 2 times per week and try to get her family members involved. -I will look into prior Auth for Ozempic -Return in 1 month for follow-up

## 2023-05-23 NOTE — Progress Notes (Addendum)
    SUBJECTIVE:   CHIEF COMPLAINT / HPI:    Patient last seen 05/03/2023 for soft tissue infection, may be related HS vs folliculitis and cellulitis.  Given Rx doxycycline.  Today states she never started the doxy as the pharmacy did have it but that it resolved on its own. Now has a new lesion in same area but denies and drainage or pain. Continues to uses clindamycin swabs daily when new lesions appear.   Obesity The following goals were set at last visit: Exercise goal: 2x/week Squats: 15 in a set x 3 Over head shoulder press: 10 in a set x 3 - can increase to 15-20 in a set if 10 is too easy Bicep curls: 10 in a set x 3 - can increase to 15-20 in a set if 10 is too easy Mountain climbers: 10 in a set x 3 - can increase to 15-20 in a set if 10 is too easy   Dietary goal: Plan out healthy snacks/meals to carry in lunch box : whole fruits, sandwiches, greek yogurt, nuts, cheese. Etc to limit fast foods and avoid skipping meals. Goal of only 1 fast food meal per week and no missed meals.   She was given rx Ozempic 0.25 mg injected weekly but insurance wouldn't cover.  Today she states she has been doing the exercises sometimes on the weekends but not on week days d/t working and being tired. Did get a lunch box - has been eating more apples, pineapple, strawberries, tomato and cheese sandwhiches, cooking chicken, broccoli and rice and has been limiting fast food to only 1x/week.   PERTINENT  PMH / PSH: PCOS, at bedtime, obesity  OBJECTIVE:   BP 120/68   Pulse 76   Ht 5\' 4"  (1.626 m)   Wt 243 lb 3.2 oz (110.3 kg)   LMP 05/03/2023   SpO2 100%   BMI 41.75 kg/m    General: NAD, pleasant Cardiac: Well-perfused Respiratory: Breathing comfortably on room air Skin: Small erythematous lesion underneath left breast, nontender, nondraining.  Scarring noted in left axilla.  See photos Psych: Normal affect and mood      ASSESSMENT/PLAN:   Hidradenitis suppurativa 1 new lesion under  left breast but does not appear to be infected, not painful or bothering patient.  -Increase clindamycin swabs to daily to prevent new lesions, refill sent to pharmacy -Return if any nodular, draining, painful lesions develop   Obesity Will continue current dietary and exercise goals as in HPI, patient encouraged to increase exercise to 2 times per week and try to get her family members involved. -I will look into prior Auth for Ozempic -Return in 1 month for follow-up   Will increase Ozempic, Rx 0.5 mg sent to pharmacy.  Can increase to 1 mg in 4 weeks if tolerating well.  Will keep the same exercise and dietary goals and plan for follow-up in 3 to 4 weeks.  Refill birth control sent to pharmacy - missed a dose last week but has not been sexually active.  Dr. Erick Alley, DO [place] [facility]

## 2023-05-27 ENCOUNTER — Other Ambulatory Visit: Payer: Self-pay | Admitting: Student

## 2023-05-28 ENCOUNTER — Other Ambulatory Visit (HOSPITAL_COMMUNITY): Payer: Self-pay

## 2023-05-28 ENCOUNTER — Telehealth: Payer: Self-pay

## 2023-05-28 NOTE — Telephone Encounter (Signed)
Pharmacy Patient Advocate Encounter   Received notification from Patient Pharmacy that prior authorization for [facility] is required/requested.   Insurance verification completed.   The patient is insured through [facility] .   Per test claim: PA required; PA submitted to above mentioned insurance via CoverMyMeds Key/confirmation #/[id number]. Status is pending

## 2023-05-31 ENCOUNTER — Other Ambulatory Visit: Payer: Self-pay | Admitting: Student

## 2023-05-31 MED ORDER — WEGOVY 0.25 MG/0.5ML ~~LOC~~ SOAJ: 0.2500 mg | SUBCUTANEOUS | 0 refills | Status: DC

## 2023-06-03 NOTE — Telephone Encounter (Signed)
Pharmacy Patient Advocate Encounter  Received notification from [facility] that Prior Authorization for [facility] has been APPROVED from 05/28/23 to 11/24/23

## 2023-06-24 ENCOUNTER — Ambulatory Visit (INDEPENDENT_AMBULATORY_CARE_PROVIDER_SITE_OTHER): Payer: Medicaid Other | Admitting: Student

## 2023-06-24 ENCOUNTER — Encounter: Payer: Self-pay | Admitting: Student

## 2023-06-24 VITALS — BP 123/74 | HR 87 | Ht 64.0 in | Wt 245.0 lb

## 2023-06-24 DIAGNOSIS — Z6841 Body Mass Index (BMI) 40.0 and over, adult: Secondary | ICD-10-CM

## 2023-06-24 DIAGNOSIS — E66813 Obesity, class 3: Secondary | ICD-10-CM

## 2023-06-24 MED ORDER — WEGOVY 0.25 MG/0.5ML ~~LOC~~ SOAJ: 0.2500 mg | SUBCUTANEOUS | 1 refills | Status: DC

## 2023-06-24 MED ORDER — WEGOVY 0.25 MG/0.5ML ~~LOC~~ SOAJ: 0.2500 mg | SUBCUTANEOUS | 0 refills | Status: DC

## 2023-06-24 NOTE — Progress Notes (Unsigned)
    SUBJECTIVE:   CHIEF COMPLAINT / HPI:   Obesity Patient continues to work on dietary and exercise goals that we made at previous visit.  She started Ozempic 0.25 mg - just had first injection a few days ago. Had difficulty with first twp attempts so only has one more injection left in pen.   Exercise goal: 2x/week Squats: 15 in a set x 3 Over head shoulder press: 10 in a set x 3 - can increase to 15-20 in a set if 10 is too easy Bicep curls: 10 in a set x 3 - can increase to 15-20 in a set if 10 is too easy Mountain climbers: 10 in a set x 3 - can increase to 15-20 in a set if 10 is too easy   Dietary goal: Plan out healthy snacks/meals to carry in lunch box : whole fruits, sandwiches, greek yogurt, nuts, cheese. Etc to limit fast foods and avoid skipping meals. Goal of only 1 fast food meal per week and no missed meals.   PERTINENT  PMH / PSH: ***  OBJECTIVE:   BP 123/74   Pulse 87   Ht 5\' 4"  (1.626 m)   Wt 245 lb (111.1 kg)   SpO2 100%   BMI 42.05 kg/m    General: NAD, pleasant, able to participate in exam Cardiac: RRR, no murmurs. Respiratory: CTAB, normal effort, No wheezes, rales or rhonchi Abdomen: Bowel sounds present, nontender, nondistended, no hepatosplenomegaly. Extremities: no edema or cyanosis. Skin: warm and dry, no rashes noted Neuro: alert, no obvious focal deficits Psych: Normal affect and mood  ASSESSMENT/PLAN:   No problem-specific Assessment & Plan notes found for this encounter.     Dr. Erick Alley, DO [facility]    {    This will disappear when note is signed, click to select method of visit    :1}

## 2023-06-24 NOTE — Patient Instructions (Addendum)
It was great to see you! Thank you for allowing me to participate in your care!  I recommend that you always bring your medications to each appointment as this makes it easy to ensure you are on the correct medications and helps [place] not miss when refills are needed.  Our plans for today:  - Continue ozempic 0.25 mg weekly for a total of 4 weeks (3 more injections). If you tolerate this well, let me know and I will send in the 0.5 mg dose - can discuss at follow up visit early january.  - Continue goals: Exercise goal: 2x/week Squats: 15 in a set x 3 Over head shoulder press: 10 in a set x 3 - can increase to 15-20 in a set if 10 is too easy Bicep curls: 10 in a set x 3 - can increase to 15-20 in a set if 10 is too easy Mountain climbers: 10 in a set x 3 - can increase to 15-20 in a set if 10 is too easy   Dietary goal: Plan out healthy snacks/meals to carry in lunch box : whole fruits, sandwiches, greek yogurt, nuts, cheese. Etc to limit fast foods and avoid skipping meals. Goal of only 1 fast food meal per week and no missed meals.    Take care and seek immediate care sooner if you develop any concerns.   Dr. Erick Alley, DO [facility]

## 2023-06-25 NOTE — Assessment & Plan Note (Signed)
Plan as in AVS:  Our plans for today:  - Continue ozempic 0.25 mg weekly for a total of 4 weeks (3 more injections). If you tolerate this well, let me know and I will send in the 0.5 mg dose - can discuss at follow up visit early january.  - Continue goals: Exercise goal: 2x/week Squats: 15 in a set x 3 Over head shoulder press: 10 in a set x 3 - can increase to 15-20 in a set if 10 is too easy Bicep curls: 10 in a set x 3 - can increase to 15-20 in a set if 10 is too easy Mountain climbers: 10 in a set x 3 - can increase to 15-20 in a set if 10 is too easy   Dietary goal: Plan out healthy snacks/meals to carry in lunch box : whole fruits, sandwiches, greek yogurt, nuts, cheese. Etc to limit fast foods and avoid skipping meals. Goal of only 1 fast food meal per week and no missed meals.

## 2023-07-21 ENCOUNTER — Other Ambulatory Visit: Payer: Self-pay | Admitting: Student

## 2023-09-27 ENCOUNTER — Ambulatory Visit (INDEPENDENT_AMBULATORY_CARE_PROVIDER_SITE_OTHER): Admitting: Student

## 2023-09-27 ENCOUNTER — Encounter: Payer: Self-pay | Admitting: Student

## 2023-09-27 VITALS — BP 128/71 | HR 77 | Ht 63.0 in | Wt 252.4 lb

## 2023-09-27 DIAGNOSIS — L732 Hidradenitis suppurativa: Secondary | ICD-10-CM

## 2023-09-27 DIAGNOSIS — E282 Polycystic ovarian syndrome: Secondary | ICD-10-CM

## 2023-09-27 DIAGNOSIS — Z6841 Body Mass Index (BMI) 40.0 and over, adult: Secondary | ICD-10-CM | POA: Diagnosis not present

## 2023-09-27 DIAGNOSIS — Z309 Encounter for contraceptive management, unspecified: Secondary | ICD-10-CM | POA: Diagnosis not present

## 2023-09-27 DIAGNOSIS — E66813 Obesity, class 3: Secondary | ICD-10-CM

## 2023-09-27 LAB — POCT URINE PREGNANCY: Preg Test, Ur: NEGATIVE

## 2023-09-27 MED ORDER — NORGESTIMATE-ETH ESTRADIOL 0.25-35 MG-MCG PO TABS: 1.0000 | ORAL_TABLET | Freq: Every day | ORAL | 11 refills | Status: AC

## 2023-09-27 MED ORDER — CLINDAMYCIN PHOSPHATE 1 % EX SWAB: 1.0000 | Freq: Two times a day (BID) | CUTANEOUS | 1 refills | Status: AC

## 2023-09-27 NOTE — Progress Notes (Unsigned)
    SUBJECTIVE:   CHIEF COMPLAINT / HPI:   Obesity:  Previously started Ozempic, completed 4 injections and never got refills.  Has decided she does not want to take  Started a new job with good hours M-F. Has been going ot the gym with her sister, going 3x/week - lifting weights and cardio  On off days,       Ran out of birth control last month LMP 08/20/2023   PERTINENT  PMH / PSH: ***  OBJECTIVE:   BP 128/71   Pulse 77   Ht 5\' 3"  (1.6 m)   Wt 252 lb 6.4 oz (114.5 kg)   LMP 08/20/2023   SpO2 100%   BMI 44.71 kg/m  ***  General: NAD, pleasant, able to participate in exam Cardiac: RRR, no murmurs. Respiratory: CTAB, normal effort, No wheezes, rales or rhonchi Abdomen: Bowel sounds present, nontender, nondistended, no hepatosplenomegaly. Extremities: no edema or cyanosis. Skin: warm and dry, no rashes noted Neuro: alert, no obvious focal deficits Psych: Normal affect and mood  ASSESSMENT/PLAN:   No problem-specific Assessment & Plan notes found for this encounter.   Pregnancy test negative today  Sprintec refills   Dr. Erick Alley, DO [place] [facility]    {    This will disappear when note is signed, click to select method of visit    :1}

## 2023-09-27 NOTE — Patient Instructions (Addendum)
 It was great to see you! Thank you for allowing me to participate in your care!  I recommend that you always bring your medications to each appointment as this makes it easy to ensure you are on the correct medications and helps [place] not miss when refills are needed.  Our plans for today:  - Refill of birth control sent to pharmacy  - Continue going to the gym 3 days per week (I'm so proud of you for going!) - goal: work out at home 3 days/week, even if only for 15 minutes  - Return 10/18/23 to check thyroid    Take care and seek immediate care sooner if you develop any concerns.   Dr. Erick Alley, DO [facility]

## 2023-09-29 NOTE — Assessment & Plan Note (Signed)
 Currently doing well on metformin and needs refill of COC as she also has PCOS -continue metformin -Pregnancy test negative today, Sprintec refilled -clindmycin swabs refilled to use BID at first sign of lesion as this typically works for her

## 2023-09-29 NOTE — Assessment & Plan Note (Signed)
 Congratulated pt on going to the gym  Made goal of getting ~15-20 minutes of cardio on days she does not go to the gym  Can discuss eating habits at next visit if desired

## 2023-10-18 ENCOUNTER — Encounter: Payer: Self-pay | Admitting: Student

## 2023-10-18 ENCOUNTER — Ambulatory Visit: Payer: Self-pay | Admitting: Student

## 2023-10-18 VITALS — BP 122/80 | HR 71 | Ht 63.0 in | Wt 246.4 lb

## 2023-10-18 DIAGNOSIS — E063 Autoimmune thyroiditis: Secondary | ICD-10-CM | POA: Diagnosis not present

## 2023-10-18 DIAGNOSIS — R21 Rash and other nonspecific skin eruption: Secondary | ICD-10-CM

## 2023-10-18 MED ORDER — HYDROCORTISONE 2.5 % EX OINT: TOPICAL_OINTMENT | Freq: Two times a day (BID) | CUTANEOUS | 3 refills | Status: DC

## 2023-10-18 NOTE — Progress Notes (Unsigned)
    SUBJECTIVE:   CHIEF COMPLAINT / HPI:   The patient, with a history of thyroid disease, presents for a routine thyroid check. She reports feeling well overall and has no complaints related to her thyroid condition.  In addition, she has a rash on her abdomen that has been present for one to two months. The rash is itchy and has not responded to over-the-counter creams, including a white cream that the patient believes was a diaper rash cream. The patient denies any known triggers for the rash, such as bug bites, and reports that the rash has not improved or worsened over time.  PERTINENT  PMH / PSH: Hypothyroidism, obesity, HS  OBJECTIVE:   BP 122/80   Pulse 71   Ht 5\' 3"  (1.6 m)   Wt 246 lb 6.4 oz (111.8 kg)   LMP 08/20/2023   SpO2 98%   BMI 43.65 kg/m    General: NAD, pleasant Cardiac: Well-perfused Respiratory: Normal effort Skin: Erythematous scaly rash on left side of abdomen-see photo Neuro: alert, no obvious focal deficits Psych: Normal affect and mood   ASSESSMENT/PLAN:   Hypothyroidism, acquired, autoimmune Evaluation of thyroid function to ensure appropriate medication dosage. Blood drawn for thyroid levels. Results will determine if medication adjustment is necessary. - Review thyroid function test results - Adjust thyroid medication dosage if necessary  Rash Pruritic rash present for 1-2 months, not in a skin fold, reducing likelihood of fungal infection. Differential includes yeast infection, contact dermatitis, or eczema. Over-the-counter baby rash cream was ineffective.  Will trial treatment with topical steroid. Informed consent obtained regarding potential fungal infection if rash worsens. - Prescribe hydrocortisone 2.5% ointment to apply twice daily - Instruct her to stop using the ointment and notify if rash worsens, indicating possible fungal infection - Advise return if rash does not improve     Dr. Erick Alley, DO [place] [facility]

## 2023-10-18 NOTE — Patient Instructions (Signed)
 It was great to see you! Thank you for allowing me to participate in your care!  I recommend that you always bring your medications to each appointment as this makes it easy to ensure you are on the correct medications and helps [place] not miss when refills are needed.  Our plans for today:  - Apply steroid ointment twice daily for rash for no longer than 2 weeks - Return if it does not improve or worsens  We are checking some labs today, I will call you if they are abnormal will send you a MyChart message or a letter if they are normal.  If you do not hear about your labs in the next 2 weeks please let us know.  Take care and seek immediate care sooner if you develop any concerns.   Dr. Erick Alley, DO [facility]

## 2023-10-19 ENCOUNTER — Encounter: Payer: Self-pay | Admitting: Student

## 2023-10-19 ENCOUNTER — Telehealth: Payer: Self-pay | Admitting: Student

## 2023-10-19 DIAGNOSIS — R21 Rash and other nonspecific skin eruption: Secondary | ICD-10-CM | POA: Insufficient documentation

## 2023-10-19 LAB — TSH: TSH: 2.32 u[IU]/mL (ref 0.450–4.500)

## 2023-10-19 NOTE — Telephone Encounter (Signed)
error 

## 2023-10-19 NOTE — Assessment & Plan Note (Signed)
 Evaluation of thyroid function to ensure appropriate medication dosage. Blood drawn for thyroid levels. Results will determine if medication adjustment is necessary. - Review thyroid function test results - Adjust thyroid medication dosage if necessary

## 2023-10-19 NOTE — Assessment & Plan Note (Signed)
 Pruritic rash present for 1-2 months, not in a skin fold, reducing likelihood of fungal infection. Differential includes yeast infection, contact dermatitis, or eczema. Over-the-counter baby rash cream was ineffective.  Will trial treatment with topical steroid. Informed consent obtained regarding potential fungal infection if rash worsens. - Prescribe hydrocortisone 2.5% ointment to apply twice daily - Instruct her to stop using the ointment and notify if rash worsens, indicating possible fungal infection - Advise return if rash does not improve

## 2023-11-17 ENCOUNTER — Ambulatory Visit (HOSPITAL_COMMUNITY)
Admission: EM | Admit: 2023-11-17 | Discharge: 2023-11-17 | Disposition: A | Discharge status: Home / Self Care | Attending: Emergency Medicine | Admitting: Emergency Medicine

## 2023-11-17 ENCOUNTER — Encounter (HOSPITAL_COMMUNITY): Payer: Self-pay | Admitting: Emergency Medicine

## 2023-11-17 DIAGNOSIS — R197 Diarrhea, unspecified: Secondary | ICD-10-CM | POA: Diagnosis not present

## 2023-11-17 DIAGNOSIS — R112 Nausea with vomiting, unspecified: Secondary | ICD-10-CM

## 2023-11-17 DIAGNOSIS — K2900 Acute gastritis without bleeding: Secondary | ICD-10-CM

## 2023-11-17 LAB — POCT URINALYSIS DIP (MANUAL ENTRY)
Blood, UA: NEGATIVE
Glucose, UA: NEGATIVE mg/dL
Leukocytes, UA: NEGATIVE
Nitrite, UA: NEGATIVE
Protein Ur, POC: 30 mg/dL — AB
Spec Grav, UA: >=1.03 — AB (ref 1.010–1.025)
Urobilinogen, UA: 0.2 U/dL
pH, UA: 5.5 (ref 5.0–8.0)

## 2023-11-17 LAB — CBC
HCT: 37.9 % (ref 36.0–46.0)
Hemoglobin: 12.8 g/dL (ref 12.0–15.0)
MCH: 29.2 pg (ref 26.0–34.0)
MCHC: 33.8 g/dL (ref 30.0–36.0)
MCV: 86.5 fL (ref 80.0–100.0)
Platelets: 487 10*3/uL — ABNORMAL HIGH (ref 150–400)
RBC: 4.38 MIL/uL (ref 3.87–5.11)
RDW: 13.1 % (ref 11.5–15.5)
WBC: 15.4 10*3/uL — ABNORMAL HIGH (ref 4.0–10.5)
nRBC: 0 % (ref 0.0–0.2)

## 2023-11-17 LAB — COMPREHENSIVE METABOLIC PANEL WITH GFR
ALT: 13 U/L (ref 0–44)
AST: 15 U/L (ref 15–41)
Albumin: 3.8 g/dL (ref 3.5–5.0)
Alkaline Phosphatase: 55 U/L (ref 38–126)
Anion gap: 10 (ref 5–15)
BUN: 10 mg/dL (ref 6–20)
CO2: 24 mmol/L (ref 22–32)
Calcium: 9.3 mg/dL (ref 8.9–10.3)
Chloride: 105 mmol/L (ref 98–111)
Creatinine, Ser: 0.63 mg/dL (ref 0.44–1.00)
GFR, Estimated: >60 mL/min (ref >60)
Glucose, Bld: 79 mg/dL (ref 70–99)
Potassium: 3.4 mmol/L — ABNORMAL LOW (ref 3.5–5.1)
Sodium: 139 mmol/L (ref 135–145)
Total Bilirubin: 0.7 mg/dL (ref 0.0–1.2)
Total Protein: 7.8 g/dL (ref 6.5–8.1)

## 2023-11-17 LAB — LIPASE, BLOOD: Lipase: 30 U/L (ref 11–51)

## 2023-11-17 LAB — POCT URINE PREGNANCY: Preg Test, Ur: NEGATIVE

## 2023-11-17 MED ORDER — ONDANSETRON 4 MG PO TBDP: 4.0000 mg | ORAL_TABLET | Freq: Three times a day (TID) | ORAL | 0 refills | Status: AC | PRN

## 2023-11-17 MED ORDER — ONDANSETRON 4 MG PO TBDP
4.0000 mg | ORAL_TABLET | Freq: Once | ORAL | Status: AC
  Administered 2023-11-17: 4 mg via ORAL

## 2023-11-17 MED ORDER — ONDANSETRON 4 MG PO TBDP
ORAL_TABLET | ORAL | Status: AC
  Filled 2023-11-17: qty 1

## 2023-11-17 NOTE — ED Provider Notes (Signed)
 [facility]    CSN: [id number] Arrival date & time: 11/17/23  1530      History   Chief Complaint Chief Complaint  Patient presents with   Nausea   Emesis    HPI Jenna Benson is a 23 y.o. female.   Patient presents with nausea x 2 weeks.  Patient states that since 4/24 she has been unable to keep any food or liquids down due to vomiting shortly after eating and drinking.  Patient states that she is also had some diarrhea as well.  Patient endorses some abdominal cramping with eating and only with vomiting and diarrhea.  Denies fever, constant abdominal pain, blood in stool or vomit, dysuria, hematuria, urinary frequency/urgency, vaginal bleeding, flank pain, dizziness, and passing out.  LMP 11/06/2023.  Patient states that she is sexually active.  Patient states that she has an appointment with her primary care provider next Friday.  The history is provided by the patient and medical records.  Emesis   Past Medical History:  Diagnosis Date   Asthma    Chlamydia infection affecting pregnancy in first trimester 08/15/2019   Diagnosed 08/13/19; treated with Azithromycin  09-18-19 TOC neg Partner not treated as of 10/08/19 --> sent in rx for expedited partner treatment, patient had not had any intercourse with him since her treatment   Hypertension    Hypothyroidism, acquired, autoimmune    Obesity    Pre-diabetes    Thyroiditis, autoimmune     Patient Active Problem List   Diagnosis Date Noted   Rash 10/19/2023   Itchy skin 10/07/2022   Chronic hypertension 10/08/2019   Maternal morbid obesity, antepartum (HCC) 08/13/2019   PCOS (polycystic ovarian syndrome) 07/04/2017   Acanthosis nigricans 05/31/2017   Other allergic rhinitis 06/23/2015   Hidradenitis suppurativa 11/10/2014   Adjustment reaction 06/18/2013   Dyspepsia 08/09/2012   Goiter 08/09/2012   Hypothyroidism, acquired, autoimmune    Obesity 10/07/2011   Prediabetes 10/07/2011    Past  Surgical History:  Procedure Laterality Date   NO PAST SURGERIES      OB History     Gravida  1   Para  1   Term  1   Preterm      AB      Living  1      SAB      IAB      Ectopic      Multiple  0   Live Births  1            Home Medications    Prior to Admission medications   Medication Sig Start Date End Date Taking? Authorizing Provider  ondansetron  (ZOFRAN -ODT) 4 MG disintegrating tablet Take 1 tablet (4 mg total) by mouth every 8 (eight) hours as needed for nausea or vomiting. 11/17/23  Yes Rosevelt Constable, Antrice Pal A, NP  albuterol  (PROAIR  HFA) 108 (90 Base) MCG/ACT inhaler Inhale 2 puffs into the lungs every 6 (six) hours as needed for wheezing or shortness of breath. Patient not taking: Reported on 03/04/2020 01/10/17 08/23/17  Phelps, Jazma Y, DO  Blood Pressure KIT 1 Device by Does not apply route once a week. To be monitored weekly from home 08/13/19   Anyanwu, Ugonna A, MD  clindamycin  (CLINDACIN ETZ) 1 % SWAB Apply 1 Swab topically 2 (two) times daily. 09/27/23   Glenn Lange, DO  hydrocortisone  2.5 % ointment Apply topically 2 (two) times daily. Apply to rash.  Do not use for more than 2 weeks at a  time. 10/18/23   Glenn Lange, DO  levothyroxine  (SYNTHROID ) 100 MCG tablet TAKE 1 TABLET BY MOUTH DAILY BEFORE BREAKFAST 04/22/23   Glenn Lange, DO  metFORMIN  (GLUCOPHAGE -XR) 500 MG 24 hr tablet TAKE 2 TABLETS(1000 MG) BY MOUTH IN THE MORNING AND AT BEDTIME 07/21/23   Genora Kidd, MD  norgestimate -ethinyl estradiol  (SPRINTEC 28) 0.25-35 MG-MCG tablet Take 1 tablet by mouth daily. 09/27/23   Glenn Lange, DO  Semaglutide -Weight Management (WEGOVY ) 0.25 MG/0.5ML SOAJ Inject 0.25 mg into the skin once a week. 06/24/23   Glenn Lange, DO    Family History Family History  Problem Relation Age of Onset   Thyroid  disease Maternal Aunt    Diabetes Maternal Aunt        type 2   Obesity Maternal Aunt    Cancer Maternal Aunt        stomach   Obesity Mother    Hypertension  Mother    Obesity Father    Cancer Father        prostate   Obesity Maternal Uncle    Obesity Maternal Uncle    Obesity Maternal Grandmother    Obesity Maternal Grandfather    Hypertension Paternal Uncle    Hypertension Paternal Uncle     Social History Social History   Tobacco Use   Smoking status: Never   Smokeless tobacco: Never   Tobacco comments:    brother smokes outise of house  Vaping Use   Vaping status: Never Used  Substance Use Topics   Alcohol use: No    Alcohol/week: 0.0 standard drinks of alcohol   Drug use: No     Allergies   Patient has no known allergies.   Review of Systems Review of Systems  Gastrointestinal:  Positive for vomiting.   Per HPI  Physical Exam Triage Vital Signs ED Triage Vitals [11/17/23 1640]  Encounter Vitals Group     BP 125/78     Systolic BP Percentile      Diastolic BP Percentile      Pulse Rate 80     Resp 16     Temp 97.9 F (36.6 C)     Temp Source Oral     SpO2 97 %     Weight      Height      Head Circumference      Peak Flow      Pain Score 0     Pain Loc      Pain Education      Exclude from Growth Chart    No data found.  Updated Vital Signs BP 125/78 (BP Location: Right Arm)   Pulse 80   Temp 97.9 F (36.6 C) (Oral)   Resp 16   LMP 10/19/2023   SpO2 97%   Visual Acuity Right Eye Distance:   Left Eye Distance:   Bilateral Distance:    Right Eye Near:   Left Eye Near:    Bilateral Near:     Physical Exam Vitals and nursing note reviewed.  Constitutional:      General: She is awake. She is not in acute distress.    Appearance: Normal appearance. She is well-developed and well-groomed. She is not ill-appearing.  Cardiovascular:     Rate and Rhythm: Normal rate and regular rhythm.  Pulmonary:     Effort: Pulmonary effort is normal.     Breath sounds: Normal breath sounds.  Abdominal:     General: Abdomen is protuberant. Bowel sounds are normal. There is  no distension.      Palpations: Abdomen is soft.     Tenderness: There is abdominal tenderness in the right upper quadrant and epigastric area. There is no right CVA tenderness, left CVA tenderness, guarding or rebound. Negative signs include Murphy's sign.  Skin:    General: Skin is warm and dry.  Neurological:     Mental Status: She is alert.  Psychiatric:        Behavior: Behavior is cooperative.      UC Treatments / Results  Labs (all labs ordered are listed, but only abnormal results are displayed) Labs Reviewed  POCT URINALYSIS DIP (MANUAL ENTRY) - Abnormal; Notable for the following components:      Result Value   Color, UA yellow (*)    Clarity, UA cloudy (*)    Bilirubin, UA small (*)    Ketones, POC UA moderate (40) (*)    Spec Grav, UA >=1.030 (*)    Protein Ur, POC =30 (*)    All other components within normal limits  CBC  COMPREHENSIVE METABOLIC PANEL WITH GFR  LIPASE, BLOOD  POCT URINE PREGNANCY    EKG   Radiology No results found.  Procedures Procedures (including critical care time)  Medications Ordered in UC Medications  ondansetron  (ZOFRAN -ODT) disintegrating tablet 4 mg (4 mg Oral Given 11/17/23 1801)    Initial Impression / Assessment and Plan / UC Course  I have reviewed the triage vital signs and the nursing notes.  Pertinent labs & imaging results that were available during my care of the patient were reviewed by me and considered in my medical decision making (see chart for details).     Patient is well-appearing.  Patient does appear a little pale, but otherwise is not ill-appearing. Vitals stable.  Upon assessment patient has very mild tenderness to the right upper quadrant and epigastric region, without guarding or rebound tenderness.  No other significant findings upon exam.  Urinalysis revealed some signs of dehydration, but no signs of infection.  Urine pregnancy negative.  Given Zofran  in clinic with relief of nausea.  Prescribe Zofran  as needed for  nausea and vomiting.  Ordered CBC, CMP, and lipase for further evaluation of abdominal pain and to rule out electrolyte imbalance due to persistently vomiting.  Recommended patient keep appointment with primary care provider next Friday.  Discussed follow-up, return, and strict ER precautions. Final Clinical Impressions(s) / UC Diagnoses   Final diagnoses:  Nausea vomiting and diarrhea  Acute gastritis without hemorrhage, unspecified gastritis type     Discharge Instructions      Take Zofran  every 8 hours as needed for nausea and vomiting.  You did receive a dose of Zofran  in clinic today.  Around 6 PM, so do not take another dose until 2 AM if needed. Make sure you are staying well-hydrated with water and electrolyte drinks such as Gatorade and Pedialyte. We drew some lab work today and someone will call with these results require any additional treatment. Keep your appoint with your primary care doctor next Friday to follow-up about this visit. Return here as needed. If you develop severe abdominal pain, excessive vomiting, blood in vomit or stool, or fevers unrelieved by medication please seek immediate medical treatment in the emergency department.    ED Prescriptions     Medication Sig Dispense Auth. Provider   ondansetron  (ZOFRAN -ODT) 4 MG disintegrating tablet Take 1 tablet (4 mg total) by mouth every 8 (eight) hours as needed for nausea or vomiting. 10 tablet  Karon Packer, NP      PDMP not reviewed this encounter.   Levora Reas A, NP 11/17/23 [phone]

## 2023-11-17 NOTE — Discharge Instructions (Signed)
 Take Zofran  every 8 hours as needed for nausea and vomiting.  You did receive a dose of Zofran  in clinic today.  Around 6 PM, so do not take another dose until 2 AM if needed. Make sure you are staying well-hydrated with water and electrolyte drinks such as Gatorade and Pedialyte. We drew some lab work today and someone will call with these results require any additional treatment. Keep your appoint with your primary care doctor next Friday to follow-up about this visit. Return here as needed. If you develop severe abdominal pain, excessive vomiting, blood in vomit or stool, or fevers unrelieved by medication please seek immediate medical treatment in the emergency department.

## 2023-11-17 NOTE — ED Triage Notes (Signed)
 Pt reports nausea for 2 weeks. Reports unable to keep liquids or foods down since last Thursday. Reports intermittent abd pains.

## 2023-11-25 ENCOUNTER — Ambulatory Visit (INDEPENDENT_AMBULATORY_CARE_PROVIDER_SITE_OTHER): Admitting: Student

## 2023-11-25 VITALS — BP 112/62 | HR 81 | Wt 248.0 lb

## 2023-11-25 DIAGNOSIS — R197 Diarrhea, unspecified: Secondary | ICD-10-CM | POA: Insufficient documentation

## 2023-11-25 DIAGNOSIS — M545 Low back pain, unspecified: Secondary | ICD-10-CM | POA: Diagnosis not present

## 2023-11-25 NOTE — Assessment & Plan Note (Addendum)
 Three weeks of nausea, vomiting, and diarrhea. Initial diagnosis of gastroenteritis at [facility] where she had negative pregnancy test, no UTI, stable electrolytes, normal kidney and liver function and normal lipase. Leukocytosis with WBC of 15.4 does indicate possible infection.  Low concern for pancreatitis given normal lipase and no abdominal pain. SBO and GERD would not cause diarrhea.  She is currently on her period so low concern for pregnancy. Symptoms improving but atypical duration for gastroenteritis.  Reassured by improvement, ability to keep food down, and benign exam and stable vitals. - Continue Zofran  as needed for nausea. - Return for further evaluation if symptoms do not improve by next week. - Consider GI pathogen panel and abdominal imaging if symptoms persist.

## 2023-11-25 NOTE — Assessment & Plan Note (Addendum)
 Muscle spasm in left paraspinal region likely due to lifting. Symptoms include soreness and tightness.  Advised against NSAIDs due to gastrointestinal symptoms.  Considered hypokalemia given GI symptoms but have very low concern for this given potassium of 3.4 at recent urgent care visit and she has been able to eat and drink with improvement in her symptoms. - Use Tylenol  for pain management as needed. - Avoid NSAIDs due to gastrointestinal symptoms. - Apply Salonpas patches for muscle relief -samples provided - Perform low back stretches twice daily, holding each stretch for ten seconds, three sets, handout provided - Return if no improvement

## 2023-11-25 NOTE — Patient Instructions (Signed)
 It was great to see you! Thank you for allowing me to participate in your care!   Our plans for today:  - Continue to Zofran  as needed - Drink plenty of fluids to stay hydrated and ensure you are eating - Perform low back stretches and exercises twice daily - Can take over-the-counter Tylenol  and apply Salonpas patches for pain - If your symptoms do not continue to improve, please return next week as we may need to do further testing   Take care and seek immediate care sooner if you develop any concerns.   Dr. Glenn Lange, DO [facility]

## 2023-11-25 NOTE — Progress Notes (Signed)
 SUBJECTIVE:   CHIEF COMPLAINT / HPI:   The patient presents with three weeks of nausea, vomiting, and diarrhea.  Nausea, vomiting, and diarrhea have persisted for three weeks, initially more severe but with recent improvement. A recent urgent care (5/1) visit resulted in a gastroenteritis diagnosis. A pregnancy test was negative, and lab work showed leukocytosis with a white cell count of 15.4 but UA, lipase and CMP were nonconcerning.   Zofran  is used as needed, with partial relief of nausea.  Vomiting has ceased for two days, but mild nausea persists, especially with certain smells at work.  She is able to keep down food and liquids.  Diarrhea has improved but continues, with three episodes yesterday and one today, described as watery and non-bloody. No fever, abdominal pain, or dysuria.  She is on her period and takes birth control. No recent medication changes.  She also admits to new left-sided low back pain. She works in a job involving lifting heavy boxes, states she was lifting a heavy box, thinks she missed stepped which is when the low back pain started.  Says it comes and goes and she describes it as a cramping or squeezing pain which sometimes radiates into the left buttocks.  It is worse with bending down and going from sitting to standing.  She states it is not bad enough that she would want prescription medications.  PERTINENT  PMH / PSH: Obesity, PCOS, hypothyroidism  OBJECTIVE:   BP 112/62   Pulse 81   Wt 248 lb (112.5 kg)   LMP 11/24/2023   SpO2 98%   BMI 43.93 kg/m    General: NAD, pleasant, well-appearing Cardiac: RRR, no murmurs. Respiratory: CTAB, normal effort, No wheezes, rales or rhonchi Abdomen: Bowel sounds present, nontender, nondistended, soft MSK: Palpable tension of the left lower paraspinal muscles which are mildly TTP, able to easily go from lying to sitting to standing without significant pain Skin: warm and dry Neuro: alert, no obvious focal  deficits Psych: Normal affect and mood  ASSESSMENT/PLAN:   Diarrhea Three weeks of nausea, vomiting, and diarrhea. Initial diagnosis of gastroenteritis at [facility] where she had negative pregnancy test, no UTI, stable electrolytes, normal kidney and liver function and normal lipase. Leukocytosis with WBC of 15.4 does indicate possible infection.  Low concern for pancreatitis given normal lipase and no abdominal pain. SBO and GERD would not cause diarrhea.  She is currently on her period so low concern for pregnancy. Symptoms improving but atypical duration for gastroenteritis.  Reassured by improvement, ability to keep food down, and benign exam and stable vitals. - Continue Zofran  as needed for nausea. - Return for further evaluation if symptoms do not improve by next week. - Consider GI pathogen panel and abdominal imaging if symptoms persist.   Acute left-sided low back pain Muscle spasm in left paraspinal region likely due to lifting. Symptoms include soreness and tightness.  Advised against NSAIDs due to gastrointestinal symptoms.  Considered hypokalemia given GI symptoms but have very low concern for this given potassium of 3.4 at recent urgent care visit and she has been able to eat and drink with improvement in her symptoms. - Use Tylenol  for pain management as needed. - Avoid NSAIDs due to gastrointestinal symptoms. - Apply Salonpas patches for muscle relief -samples provided - Perform low back stretches twice daily, holding each stretch for ten seconds, three sets, handout provided - Return if no improvement     Dr. Glenn Lange, DO [facility]

## 2023-12-22 ENCOUNTER — Telehealth: Payer: Self-pay

## 2023-12-22 NOTE — Telephone Encounter (Signed)
 Patient calls nurse line requesting appointment to discuss concerns with her chest.   She reports that for the last three days, she has been experiencing intermittent episodes of chest pressure and shortness of breath.   She denies back pain, dizziness, jaw pain or radiation of pain.   She does not have a history of cardiac issues.   She states that she feels that this could be related to anxiety.   Scheduled patient for evaluation tomorrow afternoon.   ED precautions discussed.   Elsie Halo, RN

## 2023-12-23 ENCOUNTER — Ambulatory Visit (INDEPENDENT_AMBULATORY_CARE_PROVIDER_SITE_OTHER): Admitting: Student

## 2023-12-23 ENCOUNTER — Ambulatory Visit (HOSPITAL_COMMUNITY)
Admission: RE | Admit: 2023-12-23 | Discharge: 2023-12-23 | Disposition: A | Discharge status: Home / Self Care | Source: Ambulatory Visit | Attending: Family Medicine | Admitting: Family Medicine

## 2023-12-23 ENCOUNTER — Encounter: Payer: Self-pay | Admitting: Student

## 2023-12-23 VITALS — BP 131/63 | HR 87 | Ht 63.0 in | Wt 245.8 lb

## 2023-12-23 DIAGNOSIS — R0789 Other chest pain: Secondary | ICD-10-CM | POA: Diagnosis present

## 2023-12-23 DIAGNOSIS — K5900 Constipation, unspecified: Secondary | ICD-10-CM | POA: Insufficient documentation

## 2023-12-23 DIAGNOSIS — F419 Anxiety disorder, unspecified: Secondary | ICD-10-CM | POA: Insufficient documentation

## 2023-12-23 MED ORDER — POLYETHYLENE GLYCOL 3350 17 GM/SCOOP PO POWD: 17.0000 g | Freq: Every day | ORAL | 0 refills | Status: AC

## 2023-12-23 NOTE — Patient Instructions (Addendum)
 It was great seeing you today.  As we discussed, - MiraLAX  for constipation, one capful daily in water/juice. I sent this to your pharmacy. - Your EKG was normal today. This is reassuring that your heart is working well. - Your chest pressure symptoms sound related to anxiety/panic, and I would like you to try therapy. I am so sorry about the loss of your family member. If your chest pain worsens or you feel weak/dizzy and it does not get better, please seek care    If you have any questions or concerns, please feel free to call the clinic.   Have a wonderful day,  Dr. Alieu Finnigan [facility] [phone]      Therapy and Counseling Resources Most providers on this list will take Medicaid. Patients with commercial insurance or Medicare should contact their insurance company to get a list of in network providers.  [facility] (takes children) Location 1: [address], [address] [place], [place] [address] Location 2: [address] [place], [place] [address] [phone]   [facility] (spanish speaking therapist available)(habla espanol)(take medicare and medicaid)  [address], [place], [place] [address], [place]  [email or web address] [phone]  [facility] [address] [place] [place], [place] [address] [phone]  [facility]   [address] Suite [place], [place] [address]      [phone]  [facility] (spanish available) [address]  [place], [place] [address] [phone]  [facility] (take [facility] and medicare) [address], [address]  [place], [place] [address]       [phone]     [facility] (virtual only) [phone]  [facility] [address], [place], [place] [phone]  [facility]:  [address] [place] [phone]  [facility]:  [address] [place] [phone]  [facility] (under & uninsured) [address]   [place] [phone]    [email or web address]    [facility] [address]  [place]    [phone]  [facility] -[address]     Phone:  [phone]     [facility]-  [address]  [phone]   [facility] [address]      [place], [place] [phone]  [facility]: [address] [place], [place], [place]  [phone]   [facility] (Spanish therapist) [email or web address]  [address] [place]  [address]   [place] [address]    [phone]    [facility]   [address],  [place], [place]  [phone]   [facility]  [address] [place]  [phone]  [facility]  [address] [place], [place]        [phone]  Open Access/Walk In Clinic under & uninsured  [facility]  [address] [place], [place] [phone] Crisis [phone]  [facility] [address],  (Spanish)   [address] , [place]: [phone]) 8:30 - 12; 1 - 2:30  [facility] [employer],  [address] [place], [place]    [phone]):8:30 - 12; 2 - 3PM  [facility] [employer],  [address],  [place]; [phone]):   Mon - Fri 8 AM - 5 PM  [facility] [address] [place]  MWF 12:30 to 3:00 or call to schedule an appointment  [phone]  Specific Provider options Psychology Today  [email or web address]/us  click on find a therapist  enter your zip code left side and select or tailor a therapist for your specific need.   [facility] Provider Directory [email or web address]  (Medicaid)   Follow all drop down to find a provider  Social Support program Mental Health  [place] [phone] or PhotoS[employer].pl [address], [place], [place] Recovery support and educational   24- Hour Availability:   [facility]  [address] [place], [place] [phone] Crisis [phone]  [facility] [phone]  [facility]  [phone]   [facility]  [phone] (after hours)  Therapeutic Alternative/Mobile Crisis   [phone]  [place]  National Suicide Hotline  [phone] Derrel Flies)  Call 911 or go to emergency room  [facility]  [phone]);  [place] and [employer]  [phone]); [place], [place], [place], [place], [place], [place], [place]

## 2023-12-23 NOTE — Assessment & Plan Note (Signed)
 In setting of death of family member. W/ panic attacks Discussed relationship between mind and body. Reassured against cardiac origin of chest pressure (EKG normal sinus rhythm; reviewed lab work from Nov 17 2023 which was normal except for chronic thrombocytosis and leukocytosis) Discussed CBT/psychotherapy as mainstay for coping strategies/grief counseling/panic attacks Provided with Medicaid resources

## 2023-12-23 NOTE — Progress Notes (Signed)
    SUBJECTIVE:   CHIEF COMPLAINT / HPI:   Chest pressure Intermittent for the past 4 days Not any worse with taking a deep breath in or out Hurts worse with lifting Reporting some blurred vision and feels like she will faint Works at a factory for clothes History of asthma Yesterday, she had a panic attack. Happened in the morning during work. Her uncle passed away yesterday.  Vomited yesterday; happened after she was trying to catch her breath. Also constipated  Has not been eating and drinking well because she is scared she might throw up.  PERTINENT  PMH / PSH: Hypothyroidism  OBJECTIVE:   BP 131/63   Pulse 87   Ht 5\' 3"  (1.6 m)   Wt 245 lb 12.8 oz (111.5 kg)   LMP 11/24/2023   SpO2 99%   BMI 43.54 kg/m   General: Alert and cooperative and appears to be in no acute distress Cardio: Normal S1 and S2, no S3 or S4. Rhythm is regular. No murmurs or rubs.   Pulm: Clear to auscultation bilaterally, no crackles, wheezing, or diminished breath sounds. Normal respiratory effort Abdomen: Bowel sounds normal. Abdomen soft and non-tender.  Extremities: No peripheral edema. Warm/ well perfused.  Strong radial pulses. Neuro: Cranial nerves grossly intact  ASSESSMENT/PLAN:   Constipation Abdominal exam benign Nausea likely worsened by constipation START MiraLAX  1 capful daily  Anxious mood In setting of death of family member. W/ panic attacks Discussed relationship between mind and body. Reassured against cardiac origin of chest pressure (EKG normal sinus rhythm; reviewed lab work from Nov 17 2023 which was normal except for chronic thrombocytosis and leukocytosis) Discussed CBT/psychotherapy as mainstay for coping strategies/grief counseling/panic attacks Provided with Medicaid resources     Vallorie Gayer, DO [facility]

## 2023-12-23 NOTE — Assessment & Plan Note (Signed)
 Abdominal exam benign Nausea likely worsened by constipation START MiraLAX  1 capful daily

## 2023-12-27 ENCOUNTER — Encounter: Payer: Self-pay | Admitting: *Deleted

## 2024-07-31 ENCOUNTER — Ambulatory Visit: Admitting: Family Medicine

## 2024-07-31 ENCOUNTER — Encounter: Payer: Self-pay | Admitting: Family Medicine

## 2024-07-31 VITALS — BP 128/75 | HR 80 | Temp 98.7°F | Ht 63.0 in | Wt 219.8 lb

## 2024-07-31 DIAGNOSIS — J069 Acute upper respiratory infection, unspecified: Secondary | ICD-10-CM

## 2024-07-31 DIAGNOSIS — E282 Polycystic ovarian syndrome: Secondary | ICD-10-CM | POA: Diagnosis not present

## 2024-07-31 NOTE — Progress Notes (Signed)
" ° ° °  SUBJECTIVE:   CHIEF COMPLAINT / HPI:   Sore throat about a week ago and hard to swallow. Also has associated cough and mucous production. Also has runny nose. Denies fevers, chills, n/v/ diarrhea. Otherwise feels fine.  Would like to be seen by obgyn for fertility work up. Has been pregnant before and carried child to term and desires more.   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 Discussed she will return to investigate when she is not feeling sick. She is overweight and isnt sure if she snores, but does have daytime fatigue. If tonsils are hypertrophic at baseline, would consider referral for ENT for removal.  - supportive care - return precautions discussed  PCOS (polycystic ovarian syndrome) Will refer to OBGYN for work up/treatment for PCOS and desire for pregnancy.      Gloriann Ogren, MD [facility] "

## 2024-07-31 NOTE — Patient Instructions (Addendum)
 It was wonderful to see you today.  Please bring ALL of your medications with you to every visit.   Today we talked about:  Common cold   You should rest, stay hydrated with warm liquids (tea, broth) and water, gargle with salt water (1/4-1/2 tsp salt in 8oz warm water), use throat lozenges/hard candy (not for young kids), use a humidifier, and take OTC pain relievers like acetaminophen  or ibuprofen  as directed.  If you are not better in 2 weeks ,please return.   Please see me in when you are better for evaluation of your tonsils.   PCOS I have referred you to an OBGYN  Thank you for choosing [facility].   Please call [phone] with any questions about today's appointment.  Please arrive at least 15 minutes prior to your scheduled appointments.   If you had blood work today, I will send you a MyChart message or a letter if results are normal. Otherwise, I will give you a call.   If you had a referral placed, they will call you to set up an appointment. Please give us  a call if you don't hear back in the next 2 weeks.   If you need additional refills before your next appointment, please call your pharmacy first.   Do you need your medications delivered to your home?   Well send your prescription to the [facility] for delivery.          Address: [address] [place], [place], [place] [address]          Phone: [phone]  Please call the [facility] to speak with a pharmacist and set up your home medication delivery. If you have any questions, feel free to contact us  -- were happy to help!  Other [facility] that offer affordable prices on both prescriptions and over-the-counter items, as well as convenient services like vaccinations, are  [facility], at [facility]         Address:  [address], [place], [place] [address]         Phone: [phone]  [facility],  located in the [facility]        Address: [address], [place], [place] [address]        Phone: [phone]  [facility], at [facility]       Address: [address], [place], [place] [address]       Phone: [phone]  [facility], at [facility]       Address: [address], [place], [place] [address]       Phone: [phone]  You should follow up in our clinic as needed   Gloriann Ogren, MD Family Medicine

## 2024-07-31 NOTE — Assessment & Plan Note (Signed)
 Will refer to OBGYN for work up/treatment for PCOS and desire for pregnancy.
